# Patient Record
Sex: Female | Born: 1965 | Race: Black or African American | Hispanic: No | Marital: Married | Smoking: Never smoker
Health system: Southern US, Community
[De-identification: ages and names within clinical notes are randomized; demographics above are authoritative.]

## PROBLEM LIST (undated history)

## (undated) DIAGNOSIS — D649 Anemia, unspecified: Secondary | ICD-10-CM

## (undated) DIAGNOSIS — G4730 Sleep apnea, unspecified: Secondary | ICD-10-CM

## (undated) DIAGNOSIS — M1990 Unspecified osteoarthritis, unspecified site: Secondary | ICD-10-CM

## (undated) DIAGNOSIS — G4733 Obstructive sleep apnea (adult) (pediatric): Secondary | ICD-10-CM

## (undated) DIAGNOSIS — Z87898 Personal history of other specified conditions: Secondary | ICD-10-CM

## (undated) DIAGNOSIS — M5136 Other intervertebral disc degeneration, lumbar region: Secondary | ICD-10-CM

## (undated) DIAGNOSIS — N950 Postmenopausal bleeding: Secondary | ICD-10-CM

## (undated) DIAGNOSIS — D509 Iron deficiency anemia, unspecified: Secondary | ICD-10-CM

## (undated) DIAGNOSIS — Z8719 Personal history of other diseases of the digestive system: Secondary | ICD-10-CM

## (undated) DIAGNOSIS — Z973 Presence of spectacles and contact lenses: Secondary | ICD-10-CM

## (undated) DIAGNOSIS — E538 Deficiency of other specified B group vitamins: Secondary | ICD-10-CM

## (undated) DIAGNOSIS — F411 Generalized anxiety disorder: Secondary | ICD-10-CM

## (undated) DIAGNOSIS — J309 Allergic rhinitis, unspecified: Secondary | ICD-10-CM

## (undated) DIAGNOSIS — F909 Attention-deficit hyperactivity disorder, unspecified type: Secondary | ICD-10-CM

## (undated) DIAGNOSIS — F988 Other specified behavioral and emotional disorders with onset usually occurring in childhood and adolescence: Secondary | ICD-10-CM

## (undated) DIAGNOSIS — E559 Vitamin D deficiency, unspecified: Secondary | ICD-10-CM

## (undated) DIAGNOSIS — L709 Acne, unspecified: Secondary | ICD-10-CM

## (undated) DIAGNOSIS — M51369 Other intervertebral disc degeneration, lumbar region without mention of lumbar back pain or lower extremity pain: Secondary | ICD-10-CM

## (undated) DIAGNOSIS — Z8659 Personal history of other mental and behavioral disorders: Secondary | ICD-10-CM

## (undated) HISTORY — PX: WRIST GANGLION EXCISION: SUR520

## (undated) HISTORY — DX: Anemia, unspecified: D64.9

## (undated) HISTORY — PX: TUMOR REMOVAL: SHX12

## (undated) HISTORY — DX: Other specified behavioral and emotional disorders with onset usually occurring in childhood and adolescence: F98.8

## (undated) HISTORY — PX: REDUCTION MAMMAPLASTY: SUR839

## (undated) HISTORY — PX: CYSTECTOMY: SUR359

## (undated) HISTORY — DX: Acne, unspecified: L70.9

## (undated) HISTORY — PX: FOOT SURGERY: SHX648

---

## 1997-09-21 ENCOUNTER — Encounter: Admission: RE | Admit: 1997-09-21 | Discharge: 1997-12-20 | Payer: Self-pay | Admitting: Family Medicine

## 1998-05-15 HISTORY — PX: HYSTEROSCOPY WITH RESECTOSCOPE: SHX5395

## 1998-07-30 ENCOUNTER — Other Ambulatory Visit: Admission: RE | Admit: 1998-07-30 | Discharge: 1998-07-30 | Payer: Self-pay | Admitting: Obstetrics and Gynecology

## 1998-08-17 ENCOUNTER — Ambulatory Visit (HOSPITAL_COMMUNITY): Admission: RE | Admit: 1998-08-17 | Discharge: 1998-08-17 | Payer: Self-pay | Admitting: Obstetrics and Gynecology

## 1998-08-17 ENCOUNTER — Encounter: Payer: Self-pay | Admitting: Obstetrics and Gynecology

## 1998-10-05 ENCOUNTER — Ambulatory Visit (HOSPITAL_COMMUNITY): Admission: RE | Admit: 1998-10-05 | Discharge: 1998-10-05 | Payer: Self-pay | Admitting: Obstetrics and Gynecology

## 1999-02-03 ENCOUNTER — Encounter (INDEPENDENT_AMBULATORY_CARE_PROVIDER_SITE_OTHER): Payer: Self-pay | Admitting: Specialist

## 1999-02-03 ENCOUNTER — Ambulatory Visit (HOSPITAL_COMMUNITY): Admission: RE | Admit: 1999-02-03 | Discharge: 1999-02-03 | Payer: Self-pay | Admitting: Obstetrics and Gynecology

## 1999-05-16 HISTORY — PX: TUMOR REMOVAL: SHX12

## 1999-10-14 ENCOUNTER — Encounter: Payer: Self-pay | Admitting: Internal Medicine

## 2000-01-22 HISTORY — PX: BREAST REDUCTION SURGERY: SHX8

## 2000-01-23 ENCOUNTER — Ambulatory Visit (HOSPITAL_BASED_OUTPATIENT_CLINIC_OR_DEPARTMENT_OTHER): Admission: RE | Admit: 2000-01-23 | Discharge: 2000-01-24 | Payer: Self-pay | Admitting: Specialist

## 2000-01-23 ENCOUNTER — Encounter (INDEPENDENT_AMBULATORY_CARE_PROVIDER_SITE_OTHER): Payer: Self-pay | Admitting: Specialist

## 2000-05-09 ENCOUNTER — Other Ambulatory Visit: Admission: RE | Admit: 2000-05-09 | Discharge: 2000-05-09 | Payer: Self-pay | Admitting: Obstetrics and Gynecology

## 2000-06-03 ENCOUNTER — Inpatient Hospital Stay (HOSPITAL_COMMUNITY): Admission: AD | Admit: 2000-06-03 | Discharge: 2000-06-03 | Payer: Self-pay | Admitting: Obstetrics and Gynecology

## 2000-06-06 ENCOUNTER — Inpatient Hospital Stay (HOSPITAL_COMMUNITY): Admission: AD | Admit: 2000-06-06 | Discharge: 2000-06-06 | Payer: Self-pay | Admitting: Obstetrics and Gynecology

## 2000-09-04 ENCOUNTER — Other Ambulatory Visit: Admission: RE | Admit: 2000-09-04 | Discharge: 2000-09-04 | Payer: Self-pay | Admitting: Gynecology

## 2000-10-23 ENCOUNTER — Other Ambulatory Visit: Admission: RE | Admit: 2000-10-23 | Discharge: 2000-10-23 | Payer: Self-pay | Admitting: Specialist

## 2001-04-02 ENCOUNTER — Encounter (INDEPENDENT_AMBULATORY_CARE_PROVIDER_SITE_OTHER): Payer: Self-pay

## 2001-04-02 ENCOUNTER — Inpatient Hospital Stay (HOSPITAL_COMMUNITY): Admission: AD | Admit: 2001-04-02 | Discharge: 2001-04-05 | Payer: Self-pay | Admitting: Gynecology

## 2001-04-06 ENCOUNTER — Encounter: Admission: RE | Admit: 2001-04-06 | Discharge: 2001-05-06 | Payer: Self-pay | Admitting: Gynecology

## 2001-05-27 ENCOUNTER — Other Ambulatory Visit: Admission: RE | Admit: 2001-05-27 | Discharge: 2001-05-27 | Payer: Self-pay | Admitting: Gynecology

## 2001-06-06 ENCOUNTER — Encounter: Admission: RE | Admit: 2001-06-06 | Discharge: 2001-07-06 | Payer: Self-pay | Admitting: Gynecology

## 2001-07-07 ENCOUNTER — Encounter: Admission: RE | Admit: 2001-07-07 | Discharge: 2001-08-06 | Payer: Self-pay | Admitting: Gynecology

## 2001-09-04 ENCOUNTER — Encounter: Admission: RE | Admit: 2001-09-04 | Discharge: 2001-10-04 | Payer: Self-pay | Admitting: Gynecology

## 2002-05-15 HISTORY — PX: FOOT SURGERY: SHX648

## 2002-09-04 ENCOUNTER — Other Ambulatory Visit: Admission: RE | Admit: 2002-09-04 | Discharge: 2002-09-04 | Payer: Self-pay | Admitting: Gynecology

## 2003-02-02 ENCOUNTER — Observation Stay (HOSPITAL_COMMUNITY): Admission: AD | Admit: 2003-02-02 | Discharge: 2003-02-02 | Payer: Self-pay | Admitting: Gynecology

## 2003-02-02 ENCOUNTER — Encounter: Payer: Self-pay | Admitting: Gynecology

## 2003-02-05 ENCOUNTER — Inpatient Hospital Stay (HOSPITAL_COMMUNITY): Admission: AD | Admit: 2003-02-05 | Discharge: 2003-02-05 | Payer: Self-pay | Admitting: Gynecology

## 2003-02-09 ENCOUNTER — Inpatient Hospital Stay (HOSPITAL_COMMUNITY): Admission: AD | Admit: 2003-02-09 | Discharge: 2003-02-09 | Payer: Self-pay | Admitting: Gynecology

## 2003-02-12 ENCOUNTER — Encounter: Admission: RE | Admit: 2003-02-12 | Discharge: 2003-02-12 | Payer: Self-pay | Admitting: Gynecology

## 2003-02-16 ENCOUNTER — Encounter: Admission: RE | Admit: 2003-02-16 | Discharge: 2003-02-16 | Payer: Self-pay | Admitting: Gynecology

## 2003-02-18 ENCOUNTER — Inpatient Hospital Stay (HOSPITAL_COMMUNITY): Admission: AD | Admit: 2003-02-18 | Discharge: 2003-02-21 | Payer: Self-pay | Admitting: Internal Medicine

## 2003-02-18 ENCOUNTER — Encounter (INDEPENDENT_AMBULATORY_CARE_PROVIDER_SITE_OTHER): Payer: Self-pay

## 2003-02-22 ENCOUNTER — Encounter: Admission: RE | Admit: 2003-02-22 | Discharge: 2003-03-24 | Payer: Self-pay | Admitting: Gynecology

## 2003-03-25 ENCOUNTER — Encounter: Admission: RE | Admit: 2003-03-25 | Discharge: 2003-04-24 | Payer: Self-pay | Admitting: Gynecology

## 2003-04-01 ENCOUNTER — Other Ambulatory Visit: Admission: RE | Admit: 2003-04-01 | Discharge: 2003-04-01 | Payer: Self-pay | Admitting: Obstetrics and Gynecology

## 2003-05-25 ENCOUNTER — Encounter: Admission: RE | Admit: 2003-05-25 | Discharge: 2003-06-24 | Payer: Self-pay | Admitting: Gynecology

## 2003-06-25 ENCOUNTER — Encounter: Admission: RE | Admit: 2003-06-25 | Discharge: 2003-07-25 | Payer: Self-pay | Admitting: Gynecology

## 2004-04-28 ENCOUNTER — Other Ambulatory Visit: Admission: RE | Admit: 2004-04-28 | Discharge: 2004-04-28 | Payer: Self-pay | Admitting: Gynecology

## 2005-03-13 ENCOUNTER — Encounter: Payer: Self-pay | Admitting: Internal Medicine

## 2006-04-26 ENCOUNTER — Other Ambulatory Visit: Admission: RE | Admit: 2006-04-26 | Discharge: 2006-04-26 | Payer: Self-pay | Admitting: Obstetrics and Gynecology

## 2006-04-27 ENCOUNTER — Encounter: Payer: Self-pay | Admitting: Internal Medicine

## 2007-06-13 ENCOUNTER — Encounter: Payer: Self-pay | Admitting: Internal Medicine

## 2007-06-20 ENCOUNTER — Encounter: Admission: RE | Admit: 2007-06-20 | Discharge: 2007-06-20 | Payer: Self-pay | Admitting: Family Medicine

## 2008-06-09 ENCOUNTER — Encounter (INDEPENDENT_AMBULATORY_CARE_PROVIDER_SITE_OTHER): Payer: Self-pay | Admitting: *Deleted

## 2008-07-21 ENCOUNTER — Ambulatory Visit: Payer: Self-pay | Admitting: Internal Medicine

## 2008-07-21 ENCOUNTER — Encounter (INDEPENDENT_AMBULATORY_CARE_PROVIDER_SITE_OTHER): Payer: Self-pay | Admitting: *Deleted

## 2008-07-21 DIAGNOSIS — F988 Other specified behavioral and emotional disorders with onset usually occurring in childhood and adolescence: Secondary | ICD-10-CM

## 2008-08-07 ENCOUNTER — Encounter: Payer: Self-pay | Admitting: Internal Medicine

## 2008-10-14 ENCOUNTER — Telehealth (INDEPENDENT_AMBULATORY_CARE_PROVIDER_SITE_OTHER): Payer: Self-pay | Admitting: *Deleted

## 2008-11-20 ENCOUNTER — Telehealth (INDEPENDENT_AMBULATORY_CARE_PROVIDER_SITE_OTHER): Payer: Self-pay | Admitting: *Deleted

## 2008-12-30 ENCOUNTER — Telehealth (INDEPENDENT_AMBULATORY_CARE_PROVIDER_SITE_OTHER): Payer: Self-pay | Admitting: *Deleted

## 2008-12-31 ENCOUNTER — Encounter: Admission: RE | Admit: 2008-12-31 | Discharge: 2008-12-31 | Payer: Self-pay | Admitting: Family Medicine

## 2009-01-11 LAB — CONVERTED CEMR LAB
ALT: 15 units/L (ref 0–35)
AST: 19 units/L (ref 0–37)
BUN: 10 mg/dL (ref 6–23)
Basophils Absolute: 0 10*3/uL (ref 0.0–0.1)
Basophils Relative: 0 % (ref 0–1)
CO2: 26 meq/L (ref 19–32)
Calcium: 9.6 mg/dL (ref 8.4–10.5)
Chloride: 105 meq/L (ref 96–112)
Cholesterol: 128 mg/dL (ref 0–200)
Creatinine, Ser: 0.63 mg/dL (ref 0.40–1.20)
Eosinophils Absolute: 0.2 10*3/uL (ref 0.0–0.7)
Eosinophils Relative: 3 % (ref 0–5)
Glucose, Bld: 88 mg/dL (ref 70–99)
HCT: 36.7 % (ref 36.0–46.0)
HDL: 53 mg/dL (ref >39)
Hemoglobin: 11.7 g/dL — ABNORMAL LOW (ref 12.0–15.0)
LDL Cholesterol: 59 mg/dL (ref 0–99)
Lymphocytes Relative: 36 % (ref 12–46)
Lymphs Abs: 1.9 10*3/uL (ref 0.7–4.0)
MCHC: 31.9 g/dL (ref 30.0–36.0)
MCV: 92 fL (ref 78.0–100.0)
Monocytes Absolute: 0.4 10*3/uL (ref 0.1–1.0)
Monocytes Relative: 8 % (ref 3–12)
Neutro Abs: 2.7 10*3/uL (ref 1.7–7.7)
Neutrophils Relative %: 52 % (ref 43–77)
Platelets: 180 10*3/uL (ref 150–400)
Potassium: 4.2 meq/L (ref 3.5–5.3)
RBC: 3.99 M/uL (ref 3.87–5.11)
RDW: 12.9 % (ref 11.5–15.5)
Sodium: 138 meq/L (ref 135–145)
TSH: 1.024 microintl units/mL (ref 0.350–4.500)
Total CHOL/HDL Ratio: 2.4
Triglycerides: 80 mg/dL (ref <150)
VLDL: 16 mg/dL (ref 0–40)
WBC: 5.3 10*3/uL (ref 4.0–10.5)

## 2009-01-28 ENCOUNTER — Encounter (INDEPENDENT_AMBULATORY_CARE_PROVIDER_SITE_OTHER): Payer: Self-pay | Admitting: *Deleted

## 2009-02-05 ENCOUNTER — Ambulatory Visit: Payer: Self-pay | Admitting: Internal Medicine

## 2009-02-05 DIAGNOSIS — D649 Anemia, unspecified: Secondary | ICD-10-CM

## 2009-03-12 ENCOUNTER — Telehealth (INDEPENDENT_AMBULATORY_CARE_PROVIDER_SITE_OTHER): Payer: Self-pay | Admitting: *Deleted

## 2009-04-02 ENCOUNTER — Encounter: Payer: Self-pay | Admitting: Internal Medicine

## 2009-04-02 ENCOUNTER — Ambulatory Visit: Payer: Self-pay | Admitting: Internal Medicine

## 2009-04-02 ENCOUNTER — Telehealth (INDEPENDENT_AMBULATORY_CARE_PROVIDER_SITE_OTHER): Payer: Self-pay | Admitting: *Deleted

## 2009-04-02 DIAGNOSIS — R079 Chest pain, unspecified: Secondary | ICD-10-CM

## 2009-04-02 LAB — CONVERTED CEMR LAB
Basophils Absolute: 0 10*3/uL (ref 0.0–0.1)
Basophils Relative: 0 % (ref 0–1)
Eosinophils Absolute: 0.1 10*3/uL (ref 0.0–0.7)
Hemoglobin: 11.5 g/dL — ABNORMAL LOW (ref 12.0–15.0)
MCHC: 32.3 g/dL (ref 30.0–36.0)
MCV: 90.4 fL (ref 78.0–100.0)
Monocytes Absolute: 0.6 10*3/uL (ref 0.1–1.0)
Neutro Abs: 3.4 10*3/uL (ref 1.7–7.7)
RDW: 12.3 % (ref 11.5–15.5)

## 2009-04-05 ENCOUNTER — Telehealth (INDEPENDENT_AMBULATORY_CARE_PROVIDER_SITE_OTHER): Payer: Self-pay | Admitting: *Deleted

## 2009-04-05 ENCOUNTER — Encounter (INDEPENDENT_AMBULATORY_CARE_PROVIDER_SITE_OTHER): Payer: Self-pay | Admitting: *Deleted

## 2009-04-06 ENCOUNTER — Encounter (INDEPENDENT_AMBULATORY_CARE_PROVIDER_SITE_OTHER): Payer: Self-pay | Admitting: *Deleted

## 2009-04-30 ENCOUNTER — Ambulatory Visit: Payer: Self-pay | Admitting: Internal Medicine

## 2009-05-04 ENCOUNTER — Encounter (INDEPENDENT_AMBULATORY_CARE_PROVIDER_SITE_OTHER): Payer: Self-pay | Admitting: *Deleted

## 2009-05-04 LAB — CONVERTED CEMR LAB: Fecal Occult Bld: NEGATIVE

## 2009-05-06 ENCOUNTER — Other Ambulatory Visit: Admission: RE | Admit: 2009-05-06 | Discharge: 2009-05-06 | Payer: Self-pay | Admitting: Obstetrics and Gynecology

## 2009-05-06 ENCOUNTER — Ambulatory Visit: Payer: Self-pay | Admitting: Women's Health

## 2009-06-28 ENCOUNTER — Ambulatory Visit: Payer: Self-pay | Admitting: Gynecology

## 2009-07-28 ENCOUNTER — Telehealth (INDEPENDENT_AMBULATORY_CARE_PROVIDER_SITE_OTHER): Payer: Self-pay | Admitting: *Deleted

## 2009-08-06 ENCOUNTER — Ambulatory Visit: Payer: Self-pay | Admitting: Internal Medicine

## 2009-08-31 ENCOUNTER — Telehealth (INDEPENDENT_AMBULATORY_CARE_PROVIDER_SITE_OTHER): Payer: Self-pay | Admitting: *Deleted

## 2009-10-13 ENCOUNTER — Telehealth (INDEPENDENT_AMBULATORY_CARE_PROVIDER_SITE_OTHER): Payer: Self-pay | Admitting: *Deleted

## 2009-10-21 ENCOUNTER — Ambulatory Visit: Payer: Self-pay | Admitting: Women's Health

## 2009-11-22 ENCOUNTER — Telehealth (INDEPENDENT_AMBULATORY_CARE_PROVIDER_SITE_OTHER): Payer: Self-pay | Admitting: *Deleted

## 2009-12-24 ENCOUNTER — Telehealth (INDEPENDENT_AMBULATORY_CARE_PROVIDER_SITE_OTHER): Payer: Self-pay | Admitting: *Deleted

## 2010-02-02 ENCOUNTER — Telehealth: Payer: Self-pay | Admitting: Internal Medicine

## 2010-02-10 ENCOUNTER — Ambulatory Visit: Payer: Self-pay | Admitting: Internal Medicine

## 2010-03-16 ENCOUNTER — Telehealth (INDEPENDENT_AMBULATORY_CARE_PROVIDER_SITE_OTHER): Payer: Self-pay | Admitting: *Deleted

## 2010-04-18 ENCOUNTER — Telehealth (INDEPENDENT_AMBULATORY_CARE_PROVIDER_SITE_OTHER): Payer: Self-pay | Admitting: *Deleted

## 2010-05-24 ENCOUNTER — Telehealth (INDEPENDENT_AMBULATORY_CARE_PROVIDER_SITE_OTHER): Payer: Self-pay | Admitting: *Deleted

## 2010-06-05 ENCOUNTER — Encounter: Payer: Self-pay | Admitting: Obstetrics and Gynecology

## 2010-06-12 LAB — CONVERTED CEMR LAB
Albumin: 4.1 g/dL
BUN: 8 mg/dL
Cholesterol: 175 mg/dL
Creatinine, Ser: 0.59 mg/dL
Ferritin: 52 ng/mL
Globulin: 3.4 g/dL
LDL Cholesterol: 94 mg/dL
RBC count: 3.76 10*6/uL
TIBC: 345 ug/dL
TSH: 2.255 microintl units/mL
Triglycerides: 95 mg/dL
WBC, blood: 5.7 10*3/uL

## 2010-06-14 NOTE — Progress Notes (Signed)
Summary: rx  Phone Note Refill Request Call back at [phone] Message from:  Patient on March 16, 2010 8:28 AM  Refills Requested: Medication #1:  CONCERTA 54 MG CR-TABS 1 by mouth once daily   Dosage confirmed as above?Dosage Confirmed   Supply Requested: 1 month pick up when ready//husband brought in this morning.  Initial call taken by: Freddy Jaksch,  March 16, 2010 8:28 AM  Follow-up for Phone Call        pts husband aware. Army Fossa CMA  March 16, 2010 8:33 AM     Prescriptions: CONCERTA 54 MG CR-TABS (METHYLPHENIDATE HCL) 1 by mouth once daily  #30 x 0   Entered by:   Army Fossa CMA   Authorized by:   Nolon Rod. Paz MD   Signed by:   Army Fossa CMA on 03/16/2010   Method used:   Print then Give to Patient   RxID:   [id number]

## 2010-06-14 NOTE — Progress Notes (Signed)
Summary: concerta rx  Phone Note Refill Request Call back at [phone] Message from:  Patient  Refills Requested: Medication #1:  CONCERTA 54 MG CR-TABS 1 by mouth once daily last ov 02/05/09, last refill # 30x 0 on 06/21/09 left msg rx will be ready for pickup today, Due for 6 mos vist please schedule office visit when picking up rx  Initial call taken by: Kandice Hams,  July 28, 2009 11:48 AM    Prescriptions: CONCERTA 54 MG CR-TABS (METHYLPHENIDATE HCL) 1 by mouth once daily  #30 x 0   Entered by:   Kandice Hams   Authorized by:   Nolon Rod. Paz MD   Signed by:   Kandice Hams on 07/28/2009   Method used:   Print then Give to Patient   RxID:   [id number]

## 2010-06-14 NOTE — Assessment & Plan Note (Signed)
Summary: 6 mth fu/kdc   Vital Signs:  Patient profile:   45 year old female Height:      65.5 inches Weight:      188.2 pounds BMI:     30.95 Pulse rate:   60 / minute BP sitting:   110 / 80  Vitals Entered By: R.Peeler CC: roa   History of Present Illness: here for refills  Allergies: No Known Drug Allergies  Past History:  Past Medical History: not on BC P , just doing "rhytm control" ADD G3 P3 H/O on-off  CP, previous PCP did a stress test and ECHO (-) chest pain , non cardiac ,  per patient was about  2000, saw Dr Alwyn Ren 11-10 "costochondritis" Anemia-NOS  Past Surgical History: Reviewed history from 07/21/2008 and no changes required. Caesarean section x 2  wrist cysts removal B uterine fibroid tumor removed   Review of Systems       doing well ADHD well controlled with present medications sleeps well she does have some stress, job-related, but over all denies anxiety or depression she continued to be concerned about her chest pain which is  an old problem for years, currently chest pain-free  Physical Exam  General:  alert, well-developed, and well-nourished.   Chest Wall:  not tender at the costochondral area Lungs:  normal respiratory effort, no intercostal retractions, no accessory muscle use, and normal breath sounds.   Heart:  normal rate, regular rhythm, and no murmur.     Impression & Recommendations:  Problem # 1:  ADD (ICD-314.00) symptoms well controlled, call for a concert for  RF  once a month, office visit in 6 months  Problem # 2:  CHEST PAIN (ICD-786.50) chronic on and off symptoms, would like further testing to clarify the etiology  (although she already had extensive W/Us) recommend to have a office visit with next exacerbation to see what is appropiate to do   Complete Medication List: 1)  Concerta 54 Mg Cr-tabs (Methylphenidate hcl) .Marland Kitchen.. 1 by mouth once daily 2)  Claritin 10 Mg Tabs (Loratadine) .... As directed

## 2010-06-14 NOTE — Progress Notes (Signed)
Summary: refill- concerta  Phone Note Refill Request Call back at [phone] Message from:  spouse  Refills Requested: Medication #1:  CONCERTA 54 MG CR-TABS 1 by mouth once daily Initial call taken by: Jeremy Johann CMA,  February 02, 2010 8:49 AM  Follow-up for Phone Call        Filled for pt, pt needs to make appt before additonial refills.    Prescriptions: CONCERTA 54 MG CR-TABS (METHYLPHENIDATE HCL) 1 by mouth once daily  #30 x 0   Entered by:   Army Fossa CMA   Authorized by:   Nolon Rod. Paz MD   Signed by:   Army Fossa CMA on 02/02/2010   Method used:   Print then Give to Patient   RxID:   [id number]

## 2010-06-14 NOTE — Progress Notes (Signed)
Summary: concerta refill  Phone Note Refill Request Call back at [phone] Message from:  Patient's spouse Chrissie Noa on April 18, 2010 5:03 PM  Refills Requested: Medication #1:  CONCERTA 54 MG CR-TABS 1 by mouth once daily Patient needs Concertal prescription refilled.  Advised patient that it will be ready in 24 hours for pickup unless someone calls them  Next Appointment Scheduled: mon 08/15/2010 Initial call taken by: Jerolyn Shin,  April 18, 2010 5:04 PM    Prescriptions: CONCERTA 54 MG CR-TABS (METHYLPHENIDATE HCL) 1 by mouth once daily  #30 x 0   Entered by:   Army Fossa CMA   Authorized by:   Nolon Rod. Paz MD   Signed by:   Army Fossa CMA on 04/19/2010   Method used:   Print then Give to Patient   RxID:   [id number]

## 2010-06-14 NOTE — Assessment & Plan Note (Signed)
Summary: 6 mth fu/kdc   Vital Signs:  Patient profile:   45 year old female Weight:      198.25 pounds Pulse rate:   105 / minute Pulse rhythm:   regular BP sitting:   116 / 80  (left arm) Cuff size:   large  Vitals Entered By: Army Fossa CMA (February 10, 2010 3:55 PM) CC: 6 month f/u- not fasting  Comments no complaints, declines flu shot    History of Present Illness:  here for a refill on Concerta, doing well  Current Medications (verified): 1)  Concerta 54 Mg Cr-Tabs (Methylphenidate Hcl) .Marland Kitchen.. 1 By Mouth Once Daily 2)  Claritin 10 Mg Tabs (Loratadine) .... As Directed  Allergies (verified): No Known Drug Allergies  Past History:  Past Medical History: not on BC P , just doing "rhytm control" ADD G3 P3 H/O on-off  CP, previous PCP did a stress test and ECHO (-) chest pain , non cardiac ,  per patient was about  2000, saw Dr Alwyn Ren 11-10 "costochondritis" Anemia-NOS  Past Surgical History: Reviewed history from 07/21/2008 and no changes required. Caesarean section x 2  wrist cysts removal B uterine fibroid tumor removed   Social History: Reviewed history from 04/02/2009 and no changes required. 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   Lungs:  normal respiratory effort, no intercostal retractions, no accessory muscle use, and normal breath sounds.   Heart:  normal rate, regular rhythm, and no murmur.   Psych:  not anxious appearing and not depressed appearing.     Impression & Recommendations:  Problem # 1:  ADD (ICD-314.00) well controlled, refill Concerta when needed  Problem # 2:  ANEMIA-NOS (ICD-285.9) history of anemia, hemoglobin is stable over time, no history of iron  deficiency. Plan: Recheck on return to the office  Problem # 3:  recommend him back in 6 months for a physical  Complete Medication List: 1)  Concerta 54 Mg Cr-tabs (Methylphenidate hcl) .Marland Kitchen.. 1 by mouth once daily 2)  Claritin 10 Mg Tabs (Loratadine) .... As directed  Patient Instructions: 1)  Please schedule a follow-up appointment in 6 months .

## 2010-06-14 NOTE — Progress Notes (Signed)
Summary: Refill Request  Phone Note Refill Request Message from:  Patient on November 22, 2009 10:25 AM  Refills Requested: Medication #1:  CONCERTA 54 MG CR-TABS 1 by mouth once daily   Dosage confirmed as above?Dosage Confirmed   Supply Requested: 1 month  Method Requested: Pick up at Office Next Appointment Scheduled: 9.26.11 Initial call taken by: Harold Barban,  November 22, 2009 10:25 AM  Follow-up for Phone Call        Pt is aware rx is ready.Army Fossa CMA  November 22, 2009 11:18 AM     Prescriptions: CONCERTA 54 MG CR-TABS (METHYLPHENIDATE HCL) 1 by mouth once daily  #30 x 0   Entered by:   Army Fossa CMA   Authorized by:   Nolon Rod. Paz MD   Signed by:   Army Fossa CMA on 11/22/2009   Method used:   Print then Give to Patient   RxID:   [id number]

## 2010-06-14 NOTE — Progress Notes (Signed)
Summary: refill--concerta  Phone Note Refill Request Message from:  Patient on December 24, 2009 11:16 AM  Refills Requested: Medication #1:  CONCERTA 54 MG CR-TABS 1 by mouth once daily   Dosage confirmed as above?Dosage Confirmed   Supply Requested: 1 month   Last Refilled: 11/22/2009 Next Appointment Scheduled: 02-07-10 Dr Drue Novel Initial call taken by: Mervin Kung CMA Duncan Dull),  December 24, 2009 11:17 AM  Follow-up for Phone Call        spoke with pt aware rx will be ready after 3 pm. Army Fossa CMA  December 24, 2009 11:19 AM     Prescriptions: CONCERTA 54 MG CR-TABS (METHYLPHENIDATE HCL) 1 by mouth once daily  #30 x 0   Entered by:   Mervin Kung CMA (AAMA)   Authorized by:   Nolon Rod. Paz MD   Signed by:   Mervin Kung CMA (AAMA) on 12/24/2009   Method used:   Print then Give to Patient   RxID:   [id number]

## 2010-06-14 NOTE — Progress Notes (Signed)
Summary: refill concerta  Phone Note Refill Request Call back at [phone] Message from:  Patient  Refills Requested: Medication #1:  CONCERTA 54 MG CR-TABS 1 by mouth once daily left msg rx will be ready for pickup today .Kandice Hams  October 13, 2009 8:40 AM   Initial call taken by: Kandice Hams,  October 13, 2009 8:41 AM    Prescriptions: CONCERTA 54 MG CR-TABS (METHYLPHENIDATE HCL) 1 by mouth once daily  #30 x 0   Entered by:   Kandice Hams   Authorized by:   Nolon Rod. Paz MD   Signed by:   Kandice Hams on 10/13/2009   Method used:   Print then Give to Patient   RxID:   [id number]

## 2010-06-14 NOTE — Progress Notes (Signed)
Summary: rx concerta  Phone Note Refill Request Call back at [phone] Message from:  Patient  Refills Requested: Medication #1:  CONCERTA 54 MG CR-TABS 1 by mouth once daily Initial call taken by: Kandice Hams,  August 31, 2009 11:46 AM  Follow-up for Phone Call        last ov 08/06/09, last refill #30 x 0 on 07/28/09. Spoke with pt informed rx will be ready for pickup today .Kandice Hams  August 31, 2009 12:02 PM  Follow-up by: Kandice Hams,  August 31, 2009 12:02 PM    Prescriptions: CONCERTA 54 MG CR-TABS (METHYLPHENIDATE HCL) 1 by mouth once daily  #30 x 0   Entered by:   Kandice Hams   Authorized by:   Nolon Rod. Paz MD   Signed by:   Kandice Hams on 08/31/2009   Method used:   Print then Give to Patient   RxID:   [id number]

## 2010-06-16 NOTE — Progress Notes (Signed)
Summary: Refill Request  Phone Note Refill Request Call back at [phone] Message from:  Patient on May 24, 2010 8:16 AM  Refills Requested: Medication #1:  CONCERTA 54 MG CR-TABS 1 by mouth once daily  Method Requested: Pick up at Office Next Appointment Scheduled: 08/15/2010 Initial call taken by: Barnie Mort,  May 24, 2010 8:16 AM Caller: Spouse  Follow-up for Phone Call        Pts spouse is aware. Army Fossa CMA  May 24, 2010 8:21 AM     Prescriptions: CONCERTA 54 MG CR-TABS (METHYLPHENIDATE HCL) 1 by mouth once daily  #30 x 0   Entered by:   Army Fossa CMA   Authorized by:   Nolon Rod. Paz MD   Signed by:   Army Fossa CMA on 05/24/2010   Method used:   Print then Give to Patient   RxID:   [id number]

## 2010-06-27 ENCOUNTER — Telehealth: Payer: Self-pay | Admitting: Internal Medicine

## 2010-07-06 NOTE — Progress Notes (Signed)
Summary: Refill--Concerta  Phone Note Refill Request Call back at [phone] Message from:  Patient on June 27, 2010 2:23 PM  Refills Requested: Medication #1:  CONCERTA 54 MG CR-TABS 1 by mouth once daily Initial call taken by: Lucious Groves CMA,  June 27, 2010 2:23 PM  Follow-up for Phone Call        Father notified and will pick up prescription tomorrow. Follow-up by: Lucious Groves CMA,  June 27, 2010 3:11 PM    Prescriptions: CONCERTA 54 MG CR-TABS (METHYLPHENIDATE HCL) 1 by mouth once daily  #30 x 0   Entered by:   Lucious Groves CMA   Authorized by:   Nolon Rod. Paz MD   Signed by:   Lucious Groves CMA on 06/27/2010   Method used:   Print then Give to Patient   RxID:   [id number]

## 2010-08-04 ENCOUNTER — Other Ambulatory Visit: Payer: Self-pay | Admitting: *Deleted

## 2010-08-04 ENCOUNTER — Encounter: Payer: Self-pay | Admitting: Internal Medicine

## 2010-08-04 MED ORDER — METHYLPHENIDATE HCL ER (OSM) 54 MG PO TBCR: 54.0000 mg | EXTENDED_RELEASE_TABLET | ORAL | Status: DC

## 2010-08-04 NOTE — Telephone Encounter (Signed)
lmom aware rx ready for pickup

## 2010-08-15 ENCOUNTER — Encounter: Payer: Self-pay | Admitting: Internal Medicine

## 2010-08-26 ENCOUNTER — Other Ambulatory Visit (INDEPENDENT_AMBULATORY_CARE_PROVIDER_SITE_OTHER): Payer: BLUE CROSS/BLUE SHIELD

## 2010-08-26 ENCOUNTER — Telehealth: Payer: Self-pay | Admitting: Internal Medicine

## 2010-08-26 DIAGNOSIS — Z0000 Encounter for general adult medical examination without abnormal findings: Secondary | ICD-10-CM

## 2010-08-26 LAB — CBC WITH DIFFERENTIAL/PLATELET
Basophils Absolute: 0 10*3/uL (ref 0.0–0.1)
HCT: 35 % — ABNORMAL LOW (ref 36.0–46.0)
Hemoglobin: 11.9 g/dL — ABNORMAL LOW (ref 12.0–15.0)
Lymphs Abs: 2 10*3/uL (ref 0.7–4.0)
MCHC: 34.1 g/dL (ref 30.0–36.0)
MCV: 89.4 fl (ref 78.0–100.0)
Monocytes Absolute: 0.5 10*3/uL (ref 0.1–1.0)
Neutro Abs: 3.2 10*3/uL (ref 1.4–7.7)
Platelets: 166 10*3/uL (ref 150.0–400.0)
RDW: 12.9 % (ref 11.5–14.6)

## 2010-08-26 LAB — IRON: Iron: 79 ug/dL (ref 42–145)

## 2010-08-26 LAB — FERRITIN: Ferritin: 25 ng/mL (ref 10.0–291.0)

## 2010-08-26 LAB — LIPID PANEL
Cholesterol: 160 mg/dL (ref 0–200)
VLDL: 13 mg/dL (ref 0.0–40.0)

## 2010-08-26 LAB — BASIC METABOLIC PANEL
BUN: 10 mg/dL (ref 6–23)
CO2: 30 mEq/L (ref 19–32)
Chloride: 103 mEq/L (ref 96–112)
Glucose, Bld: 73 mg/dL (ref 70–99)
Potassium: 4.2 mEq/L (ref 3.5–5.1)
Sodium: 138 mEq/L (ref 135–145)

## 2010-08-26 LAB — HEPATIC FUNCTION PANEL
Albumin: 3.9 g/dL (ref 3.5–5.2)
Alkaline Phosphatase: 74 U/L (ref 39–117)
Total Protein: 7.4 g/dL (ref 6.0–8.3)

## 2010-08-26 NOTE — Telephone Encounter (Signed)
Pt wants an A1C test added to her lab work that was done today due to recent left eye vision change. OBGYN told her that her sugar had been high.

## 2010-08-26 NOTE — Telephone Encounter (Signed)
Pt asked about this after her lab draw. We need 2 purple tubes to do this per [facility]. We only have 1. Will redraw and her physical.

## 2010-09-05 ENCOUNTER — Encounter: Payer: Self-pay | Admitting: Internal Medicine

## 2010-09-05 ENCOUNTER — Ambulatory Visit (INDEPENDENT_AMBULATORY_CARE_PROVIDER_SITE_OTHER): Payer: BLUE CROSS/BLUE SHIELD | Admitting: Internal Medicine

## 2010-09-05 DIAGNOSIS — K625 Hemorrhage of anus and rectum: Secondary | ICD-10-CM

## 2010-09-05 DIAGNOSIS — R739 Hyperglycemia, unspecified: Secondary | ICD-10-CM

## 2010-09-05 DIAGNOSIS — R7309 Other abnormal glucose: Secondary | ICD-10-CM

## 2010-09-05 DIAGNOSIS — Z0000 Encounter for general adult medical examination without abnormal findings: Secondary | ICD-10-CM | POA: Insufficient documentation

## 2010-09-05 LAB — HEMOGLOBIN A1C: Hgb A1c MFr Bld: 5.1 % (ref 4.6–6.5)

## 2010-09-05 MED ORDER — METHYLPHENIDATE HCL ER (OSM) 54 MG PO TBCR: 54.0000 mg | EXTENDED_RELEASE_TABLET | ORAL | Status: DC

## 2010-09-05 MED ORDER — TETANUS-DIPHTH-ACELL PERTUSSIS 5-2.5-18.5 LF-MCG/0.5 IM SUSP
0.5000 mL | Freq: Once | INTRAMUSCULAR | Status: AC
  Administered 2010-09-05: 0.5 mL via INTRAMUSCULAR

## 2010-09-05 NOTE — Assessment & Plan Note (Addendum)
Td 2001, and today Diet, exercise discussed Never had a Cscope , Hg stable at ~ 12, no iron def. She also had some blood in the stools. She declined to have a digital rectal exam today. By the description of her symptoms, does not sound classic for a hemorrhoidal bleed. I recommended a colonoscopy, she is quite reluctant. She eventually agreed to go talk with a GI physician and discuss a colonoscopy. All recent labs discussed . Sees gyn

## 2010-09-05 NOTE — Progress Notes (Signed)
Discussed w/ pt today.

## 2010-09-05 NOTE — Progress Notes (Signed)
  Subjective:    Patient ID: Alexis Carr, female    DOB: Oct 01, 1965, 45 y.o.   MRN: [id number]  HPI CPX Past Medical History  Diagnosis Date  . ADD (attention deficit disorder)   . Chest pain     h/o on and off CP, previous PCP did a stress test and ECHO (-) chest pain,  non cardiac, per pt was about 2000 saw Dr.Hopper 11-1- "costcochondritis"  . Anemia    Past Surgical History  Procedure Date  . Cesarean section     x2  . Cystectomy     wrist   . Tumor removal     Uterine Fibroid    History   Social History  . Marital Status: Married    Spouse Name: N/A    Number of Children: 3  . Years of Education: N/A   Occupational History  . school teacher    Social History Main Topics  . Smoking status: Never Smoker   . Smokeless tobacco: Never Used  . Alcohol Use: No  . Drug Use: No  . Sexually Active: Not on file   Other Topics Concern  . Not on file   Social History Narrative       Family History  Problem Relation Age of Onset  . Coronary artery disease      GM  . Hypertension      GM,GF  . Stroke      GM  . Throat cancer      GF  . Diabetes Neg Hx   . Colon cancer Neg Hx   . Breast cancer Neg Hx      Review of Systems Patient is somehow frustrated about her weight, she walks 40 minutes 3 times a week and thinks that she eats reasonably well. Her blood sugar was elevated when she was at her gynecologist, blood sugar a few days ago here was normal. She would like hemoglobin A1c drawn. She has a long history of chest pain. For years, she still has on an old mid- anterior chest pain without radiation. Chest pain is at baseline. No nausea, vomiting, diarrhea. Denies anxiety or depression. I had a single episode of dark-red blood mixed with the stools few weeks ago.       Objective:   Physical Exam  Constitutional: She is oriented to person, place, and time. She appears well-nourished.  HENT:  Head: Normocephalic.  Eyes: No scleral icterus.  Neck:  No tracheal deviation present.  Cardiovascular: Normal rate, regular rhythm and normal heart sounds.   No murmur heard. Pulmonary/Chest: Effort normal and breath sounds normal. No respiratory distress. She has no rales.  Abdominal: Soft. Bowel sounds are normal. She exhibits no distension. There is no tenderness.  Musculoskeletal: She exhibits no edema.  Neurological: She is alert and oriented to person, place, and time.  Psychiatric: She has a normal mood and affect. Her behavior is normal. Judgment and thought content normal.          Assessment & Plan:

## 2010-09-07 ENCOUNTER — Telehealth: Payer: Self-pay | Admitting: *Deleted

## 2010-09-07 NOTE — Telephone Encounter (Signed)
Message copied by Army Fossa on Wed Sep 07, 2010 11:55 AM ------      Message from: Willow Ora      Created: Tue Sep 06, 2010  9:08 PM       Advise patient:      DM test neg

## 2010-09-07 NOTE — Telephone Encounter (Signed)
Message left for patient to return my call.  

## 2010-09-08 NOTE — Telephone Encounter (Signed)
Message left for patient to return my call.  

## 2010-09-09 NOTE — Telephone Encounter (Signed)
Message left for patient to return my call.  

## 2010-09-12 ENCOUNTER — Encounter: Payer: Self-pay | Admitting: Internal Medicine

## 2010-09-12 ENCOUNTER — Encounter: Payer: Self-pay | Admitting: *Deleted

## 2010-09-12 NOTE — Telephone Encounter (Signed)
Will mail pt a letter.

## 2010-09-26 ENCOUNTER — Telehealth: Payer: Self-pay | Admitting: Internal Medicine

## 2010-09-26 NOTE — Telephone Encounter (Signed)
F Y I-----Rebecca at [facility] called to report that patient cancelled her appt for Tomorrow Tuesday 5/15 with [facility]

## 2010-09-30 NOTE — Discharge Summary (Signed)
[facility]  Patient:    Alexis Carr, Alexis Carr Visit Number: [id number] MRN: [id number]          Service Type: OBS Location: 910A 9113 [id number] Attending Physician:  Tonye Royalty Dictated by:   Antony Contras, N.P. Proc. Date: 04/02/01 Admit Date:  04/02/2001 Discharge Date: 04/05/2001                             Discharge Summary  DISCHARGE DIAGNOSES:          Chorioamnionitis.  PROCEDURE:                    Primary cesarean section, left flap                               transverse, with delivery of viable infant.  HISTORY OF PRESENT ILLNESS:   The patient is a 45 year old gravida 2, para 0-0-1-0 with an LMP of June 25, 2000.  [facility] March 31, 2001.  Prenatal course was complicated by advanced maternal age.  LABORATORY/ACCESSORY DATA:    Blood type O-positive.  Antibody screen negative.  Sickle cell negative.  RPR, HBsAg, HIV nonreactive.  Rubella immune.  AFP and amniocentesis normal.  HOSPITAL COURSE/TREATMENT:    The patient was admitted on April 02, 2001 with spontaneous onset of labor.  On admission, cervix was 4/80%/-3 station. Patient did progress to anterior lip, 0 to -1 station, but this progression was slow and she did develop a temperature elevation to 102.3.  She was given some ampicillin and Tylenol.  At this point, after counseling by Dr. Farrel Gobble, she elected to proceed with cesarean section.  This was performed by Dr. Farrel Gobble under epidural anesthesia.  Patient was delivered of a viable infant in vertex presentation with late onset of thin meconium.  Birth weight 7 pounds 9 ounces.  Apgars 9 and 9 at one and five minutes.  Cord pH 7.3. Normal uterus, tubes and ovaries.  POSTPARTUM COURSE:            Patient did continue to have some low-grade fever, which did resolve and she was able to be discharged on her third postoperative day in satisfactory condition.  DISPOSITION:                  Follow up in six weeks.   Continue with prenatal vitamins and iron.  Motrin and Tylox for pain. Dictated by:   Antony Contras, N.P. Attending Physician:  Tonye Royalty DD:  04/19/01 TD:  04/20/01 Job: [id number]

## 2010-09-30 NOTE — H&P (Signed)
   NAMEJOURNEI, THOMASSEN                         ACCOUNT NO.:  [id number]   MEDICAL RECORD NO.:  [id number]                   PATIENT TYPE:  INP   LOCATION:  9199                                 FACILITY:  [facility]   PHYSICIAN:  Timothy P. Fontaine, M.D.           DATE OF BIRTH:  05/09/1966   DATE OF ADMISSION:  02/18/2003  DATE OF DISCHARGE:                                HISTORY & PHYSICAL   CHIEF COMPLAINT:  Twin gestation, 37 weeks, labor, prior cesarean section  for repeat cesarean section.   HISTORY OF PRESENT ILLNESS:  A 45 year old G46, P27, AB1 female with twin  gestation of 37 weeks, history of prior cesarean section for repeat cesarean  section.  The patient awoke this morning noting contractions every two to  three minutes and on triage evaluation, was found to be 5 cm dilated per  nursing check.   ALLERGIES:  No known drug allergies.   MEDICATIONS:  Vitamins.   PAST MEDICAL HISTORY:  Uncomplicated.   PAST SURGICAL HISTORY:  1. Cesarean section in 2002.  2. D&C.   REVIEW OF SYMPTOMS:  Noncontributory.   FAMILY HISTORY:  Noncontributory.   ADMISSION PHYSICAL EXAMINATION:  GENERAL APPEARANCE:  VITAL SIGNS:  Afebrile.  Vital signs are stable.  HEENT:  Normal.  LUNGS:  Clear.  CARDIOVASCULAR:  Regular rate with no murmurs, rubs, or gallops.  ABDOMEN:  Gravid pregnancy consistent with twins.  External monitor shows  contractions every two minutes with reactive fetal tracing on both twins.  PELVIC:  Per nursing with 5 cm dilated, vertex presentation.   ASSESSMENT:  At 37 weeks, twins, prior cesarean section for repeat, in  labor.  For repeat cesarean section.  I discussed with the patient and her  husband the proposed surgery, the risks and benefits to include bleeding,  transfusion, infection, wound complications requiring opening and draining  of incisions, closure by secondary intention, inadvertant injury to internal  organs including bowel, bladder, ureters,  vessels and nerves necessitating  major exploratory reparative surgeries and future reparative surgeries  including ostomy formation.  The risks of fetal injury during the procedure  was also discussed, understood and accepted.                                              Timothy P. Audie Box, M.D.   TPF/MEDQ  D:  02/18/2003  T:  02/18/2003  Job:  [id number]

## 2010-09-30 NOTE — H&P (Signed)
[facility]  Patient:    Alexis Carr, Alexis Carr Visit Number: [id number] MRN: [id number]          Service Type: Attending:  Gaetano Hawthorne. Lily Peer, M.D. Dictated by:   Gaetano Hawthorne Lily Peer, M.D. Adm. Date:  04/02/01                           History and Physical  CHIEF COMPLAINT:              Contractions.  HISTORY OF PRESENT ILLNESS:   The patient is a 45 year old gravida 2 para 0 AB 1, with an estimated date of confinement of March 31, 2001, currently at 40-2/7th weeks gestation, who presented to the emergency room of [facility] complaining of contractions that started at approximately 1900 hours today.  Fetal movements were appreciated.  The patient denied any fever or chills, nausea or vomiting, and her membranes were intact.  When placed on monitor she was found to be contracting every three to six minutes apart with reassuring fetal heart rate tracing.  The cervix was found to be 3 cm, 80% effaced, and -2/3 station.  The patient was immediately admitted to labor and delivery, where she underwent artificial rupture of membranes with clear amniotic fluid.  Fetal scalp electrode and intrauterine pressure catheter were placed.  The cervix was 4 cm, 80% effaced, -2 station. Clear amniotic fluid was evident.  Reassuring fetal heart rate tracing. Contractions every five to six minutes apart.  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  The patient also had been treated for urinary tract infection during her pregnancy as well, and her GBS was negative.  ALLERGIES:                    SHRIMP.  PAST MEDICAL HISTORY:         1. Spontaneous AB at seven weeks gestation in                                  April 2000.                               2. Right breast excision of a mass during this      pregnancy which was benign, done by Dr.                                  Earvin Hansen L. [place].                               3. D&C in the past.  SOCIAL HISTORY:               She denies smoking or alcohol consumption.  REVIEW OF SYSTEMS:            See ______ form.  PHYSICAL EXAMINATION:  VITAL SIGNS:                  Admission blood pressure 126/100, on repeat 123/85.  Temperature 97.4 degrees.  Pulse 89.  Respirations 14.  HEENT:                        Unremarkable.  NECK:                         Supple.  Trachea midline.  No carotid bruits. No thyromegaly.  LUNGS:                        Clear to auscultation without rhonchi or wheezes.  HEART:                        Regular rate and rhythm.  No murmurs or gallops.  BREAST:                       Examination not done.  ABDOMEN:                      Gravid uterus.  Vertex presentation by [facility] Cushing maneuver.  Positive fetal heart tones.  PELVIC:                       Cervix 4 cm dilated, 80% effaced, -3 station.  EXTREMITIES:                  DTRs 1+.  Negative clonus.  Trace edema.  PRENATAL LABORATORY DATA:     Blood type O-positive.  Negative antibody screen.  Sickle cell trait negative.  VDRL, hepatitis B surface antigen, and HIV negative.  Rubella titer with evidence of immunity.  Pap smear had been normal.  Genetic amniocentesis, 75 XX.  Blood sugar screen normal.  GBS culture negative.  ASSESSMENT:                   This is a 45 year old gravida 2 para 0 abortus 1 at 40-2/7th weeks gestation, in labor, with advanced cervical dilatation, who underwent artificial rupture of membranes with clear amniotic fluid.  PLAN:                         IUPC and fetal scalp electrode were placed.  The patient had received 1 mg of Stadol with 12.5 mg Phenergan at approximately 0345 hours and is now requesting an epidural.  She is currently getting a fluid bolus prior to her epidural placement.  Fetal heart rate tracing  is reassuring.  Vital signs are stable.  The patient is afebrile.  Will augment with Pitocin once epidural is on board.  Plan is per assessment above. Dictated by:   Gaetano Hawthorne Lily Peer, M.D. Attending:  Gaetano Hawthorne. Lily Peer, M.D. DD:  04/02/01 TD:  04/02/01 Job: [id number]

## 2010-09-30 NOTE — Op Note (Signed)
Alexis Carr, Alexis Carr                         ACCOUNT NO.:  [id number]   MEDICAL RECORD NO.:  [id number]                   PATIENT TYPE:  INP   LOCATION:  9104                                 FACILITY:  [facility]   PHYSICIAN:  Timothy P. Fontaine, M.D.           DATE OF BIRTH:  1966-04-25   DATE OF PROCEDURE:  02/18/2003  DATE OF DISCHARGE:                                 OPERATIVE REPORT   PREOPERATIVE DIAGNOSES:  Twins 37 weeks; prior cesarean section, for repeat  cesarean section; labor.   POSTOPERATIVE DIAGNOSES:  Twins 37 weeks; prior cesarean section, for repeat  cesarean section; labor; malpresentation; Twin A frank breech; Twin B  transverse.   SURGEON:  Timothy P. Fontaine, M.D.   ASSISTANT:  Scrub technician.   SURGERY:  Repeat low transverse cervical cesarean section.   ANESTHETIC:  Spinal.   ESTIMATED BLOOD LOSS:  500 mL.   COMPLICATIONS:  None.   SPECIMENS:  Samples of cord blood, Twin A and Twin B, placenta and umbilical  cords.  Cord clamp applied to Twin B cord.   FINDINGS:  At 0954 hours Twin A, female, Apgars 9 and 9, weight 5 pounds 6  ounces, frank breech presentation.  At 0955 hours Twin B, female, Apgars 9 and  9, weight 5 pounds 7 ounces, transverse presentation.  Pelvic anatomy noted  to be normal.   PROCEDURE:  The patient was taken to the operating room and underwent spinal  anesthesia.  Was placed in the left tilt supine position and received an  abdominal preparation with Betadine solution.  The bladder was emptied with  an indwelling Foley catheterization, placed under sterile technique by the  nursing personnel.  The patient was draped in the usual fashion.  After  assuring adequate anesthesia, the abdomen was sharply entered through a  repeat Pfannenstiel incision, achieving adequate hemostasis at all levels.  The bladder flap was sharply and bluntly developed without difficulty.  The  uterus was sharply entered in the lower uterine segment and  bluntly extended  laterally.  The bulging membranes were ruptured and Twin A was found to be  in the frank breech presentation and underwent a breech extraction without  difficulty.  The nares and mouth were suctioned.  The cord was doubly  clamped and cut and the infant was handed to pediatrics in attendance.  Twin  B's bulging membranes were then ruptured and the twin was found to be in the  transverse presentation, which converted to a footling breech presentation  and underwent a breech extraction without difficulty.  A nuchal cord x 1 was  noted and was reduced.  The nares and mouth were suctioned.  The cord was  doubly clamped and cut and the infant was handed to pediatrics in  attendance.  Samples of cord blood from both umbilical cords were obtained.  The placenta was then spontaneously extruded and noted to be intact and was  sent to pathology.  The uterus was exteriorized.  The endometrial cavity was  explored with a sponge to remove all placental and membrane fragments.  The  patient received 1 g Ancef IV prophylaxis at this time.   The uterine incision was closed in one layer using 0 Vicryl suture in a  running interlocking stitch with one figure-of-eight subsequent suture  placed to achieve adequate hemostasis.  The uterus was returned to the  abdomen, which was copiously irrigated, adequate hemostasis visualized, and  the anterior fascia was reapproximated using 0 Vicryl suture in a running  stitch.  The subcutaneous tissues were irrigated.  Adequate hemostasis was  achieved with electrocautery.  The skin was reapproximated with staples.  Sterile dressing was applied.  The patient was taken to the recovery room in  good condition having tolerated the procedure well.                                               Timothy P. Audie Box, M.D.    TPF/MEDQ  D:  02/18/2003  T:  02/18/2003  Job:  [id number]

## 2010-09-30 NOTE — Discharge Summary (Signed)
   Alexis Carr, BOTSFORD                         ACCOUNT NO.:  [id number]   MEDICAL RECORD NO.:  [id number]                   PATIENT TYPE:  INP   LOCATION:  9104                                 [facility]ACILITY:  [facility]   PHYSICIAN:  Timothy P. Fontaine, M.D.           DATE OF BIRTH:  1965-09-04   DATE OF ADMISSION:  02/18/2003  DATE OF DISCHARGE:  02/21/2003                                 DISCHARGE SUMMARY   DISCHARGE DIAGNOSES:  1. Intrauterine pregnancy.  2. Twin gestation.  3. Delivered.  4. History of prior cesarean section for repeat cesarean section.  5. Labor.  6. Mild presentation.  7. Twin A frank breech, twin B vertex.  8. Status post repeat low transverse cesarean section by Dr. Colin Broach on February 18, 2003.   HISTORY OF PRESENT ILLNESS:  This is a 45 year old female gravida III, para  I with twin gestation who is followed during pregnancy with history of prior  cesarean section, desires repeat cesarean section, mal presentation of the  twins was noted as well.   HOSPITAL COURSE:  On February 18, 2003, the patient at 37 weeks awoke at a.m.  in labor and was found to be 5 cm dilated in triage and therefore was  admitted and underwent a repeat low transverse cesarean section and  underwent delivery of a female who was frank breech, Apgars 9 and 9, weight of  5 pounds 6 ounces, and a second female who was transverse.  Apgars 9 and 9,  weight 5 pounds 7 ounces.  There were no complications.  Postoperatively,  the patient remained afebrile and in stable condition.  She did state she  had complaints of upper lateral side numbness and was evaluated with  anesthesia, was thought to be the fault.  It should improve within 10 days  postoperatively.  Felt stable for discharge on February 21, 2003, and given  [facility] post delivery, postpartum booklet.   PHYSICAL EXAMINATION:  No findings.   LABORATORY DATA:  The patient is O positive, rubella immune.  Hemoglobin on  February 19, 2003, was 8.5.   DISPOSITION:  The patient was discharged to home, return to the office in  six weeks, and if having any problems come to the office and script for  Tylox p.r.n. pain.  She was informed by anesthesia, if the numbness did not  improve in 10 days to call them for evaluation.     Susa Loffler, P.A.                    Timothy P. Audie Box, M.D.    Ardath Sax  D:  03/13/2003  T:  03/13/2003  Job:  [id number]

## 2010-09-30 NOTE — Discharge Summary (Signed)
   NAME:  Alexis Carr, Alexis Carr                         ACCOUNT NO.:  [id number]   MEDICAL RECORD NO.:  [id number]                   PATIENT TYPE:  OBV   LOCATION:  9156                                 FACILITY:  [facility]   PHYSICIAN:  Ivor Costa. Farrel Gobble, M.D.              DATE OF BIRTH:  1965/05/19   DATE OF ADMISSION:  02/02/2003  DATE OF DISCHARGE:  02/02/2003                                 DISCHARGE SUMMARY   DISCHARGE DIAGNOSES:  1. Intrauterine pregnancy, twins, 34+ weeks, not delivered.  2. Advanced maternal age.  3. Prior cesarean section.  4. Audible deceleration of twin B in office.   HISTORY OF PRESENT ILLNESS:  This is a 45 year old female, gravida 3, para 1  with EDC of March 09, 2003.  Prenatal course was complicated by advanced  maternal age, prior cesarean section, and twin gestation spontaneous.  She  was seen for a routine OB visit on February 02, 2003, when audible  deceleration of twin B was severely detected and patient was transferred to  [facility] for NST of twins.   HOSPITAL COURSE:  On February 02, 2003, patient 34+ weeks with twins,  reported accelerations with variable decelerations on twin B.  Biophysical  profile was 8/8 for each with adequate fluid for each.  Cervix 3 cm and  closed.  The patient transferred to antepartum for prolonged monitoring.  Both twins were reactive but occasional variables on twin B with no signs of  fetal distress.  The patient was monitored for a total of two hours without  any signs of fetal distress, therefore, the patient was thought in stable  condition for discharge to home.   DISCHARGE INSTRUCTIONS:  The patient was to be seen on Thursday for a  nonstress test a.m.  If she had any problems prior to that time, she will be  seen in the office.     Susa Loffler, P.A.                    Ivor Costa. Farrel Gobble, M.D.    TSG/MEDQ  D:  02/26/2003  T:  02/26/2003  Job:  [id number]

## 2010-09-30 NOTE — Op Note (Signed)
[facility]  Patient:    Alexis Carr, Alexis Carr Visit Number: [id number] MRN: [id number]          Service Type: OBS Location: 910A 9113 01 Attending Physician:  Tonye Royalty Dictated by:   Douglass Rivers, M.D. Proc. Date: 04/02/01 Admit Date:  04/02/2001                             Operative Report  PREOPERATIVE DIAGNOSIS:       Chorioamnionitis.  POSTOPERATIVE DIAGNOSIS:      Chorioamnionitis.  PROCEDURE:                    Primary cesarean section, low flap transverse.  SURGEON:                      Douglass Rivers, M.D.  ASSISTANT:                    Scrub tech Rolan Bucco.  ANESTHESIA:                   Epidural.  IV FLUIDS:                    1700 cc lactated Ringers.  ESTIMATED BLOOD LOSS:         300 cc.  URINE OUTPUT:                 500 cc clear urine.  FINDINGS:                     Viable female infant in the vertex presentation. Late onset of thin meconium.  DeLee suctioned for clear fluid.  Birth weight was 7 lb 9 oz.  Apgars were 9 and 9.  Cord pH was 7.3.  Normal uterus, rubes and ovaries.  PATHOLOGY:                    Placenta.  COMPLICATIONS:                None.  DESCRIPTION OF PROCEDURE:     The patient was taken to the operating room and placed in the supine position with left lateral displacement.  She was prepped and draped in the usual sterile fashion.  A Pfannenstiel skin incision was made with a scalpel after adequate anesthesia was ensured and carried through the underlying layer of fascia with electrocautery.  The fascia was scored in the midline and the incision was extended laterally with the Mayo scissors. the inferior aspect of the fascial incision was grasped with Kochers.  The underlying rectus muscles were dissected off with blunt and sharp dissection. In a similar fashion, the superior aspect of the muscles were dissected off. The rectus muscles were separated in the midline.  The peritoneum  was identified and entered bluntly.  The peritoneal incision was then extended superiorly and inferiorly with good visualization of the underlying bowel and bladder.  The bladder was noted to be markedly distended, obstructing the incision.  Despite the fact that the Foley had been placed preoperatively, there was only a small amount of urine in the tube.  The Foley eventually was removed and replaced and the bladder decompressed for 400 cc of clear urine. At this point, a bladder blade was inserted.  The vesicouterine peritoneum was identified and entered sharply with Metzenbaums.  A bladder flap was created digitally.  The bladder blade was then reinserted and the lower uterine segment was incised in a transverse fashion with a scalpel.  The lower uterine segment was noted to be markedly thin.  The incision was extended laterally with Metzenbaum scissors.  Very thin meconium was noted upon entering the uterine cavity.  The infant was delivered from the vertex presentation and DeLee suctioned prior to delivery of the remaining body.  The cord was clamped and cut and the infant was handed off to the awaiting pediatricians.  Cord blood and pH were obtained.  The uterus was massaged and the placenta was allowed to separate naturally.  Of note, there was a marked amount of membranes adherent to the uterus, which were carefully taken out.  The placenta was sent for pathology.  The uterus was then cleared of all clots and debris.  The uterus was repaired in a running locked layer of #1 chromic and was noted to be hemostatic.  The pelvis was irrigated.  The gutters were cleared of all clots and debris.  The adnexa were inspected.  The incision was also noted to be hemostatic, as was the peritoneum and muscles.  The fascia was then closed with 0 Vicryl in a running fashion.  The subcu was irrigated. the skin was closed with staples.  The patient tolerated the procedure well. Sponge, lap and  instrument counts were correct x 2.  She was taken to the PACU in stable condition.  She had received Ampicillin as well as gentamicin preoperatively. Dictated by:   Douglass Rivers, M.D. Attending Physician:  Tonye Royalty DD:  04/02/01 TD:  04/03/01 Job: [id number]

## 2010-09-30 NOTE — Op Note (Signed)
[place]  Patient:    Alexis Carr, Alexis Carr                      MRN: [id number] Proc. Date: 01/22/00 Adm. Date:  [id number] Attending:  Gustavus Messing                           Operative Report  INDICATIONS:  This 45 year old lady has severe macromastia, back and shoulder pain secondary to large pendulous breasts.  PROCEDURE PLANNED:  Bilateral breast reduction and excision of accessory breast tissue.  SURGEON:  Yaakov Guthrie. Shon Hough, M.D.  ANESTHESIA:  General.  DESCRIPTION OF PROCEDURE:  Preoperatively the patient was set-up and drawn for the inferior pedicle reduction mammoplasty. Reworking the nipple areolar complex to 20 cm from the suprasternal notch. She then underwent general anesthesia and was intubated orally. Preparation was done to the chest/breast areas in routine fashion using Betadine sulfa solution, walled off with sterile towels and draped so as to make a sterile field. 0.25% Xylocaine with epinephrine was injected locally for vasoconstriction, approximately 100 cc per side. The wounds were scored with a #15 blade and then the skin over the inferior pedicle was epithelialized using a #20 blade. The medial and lateral and inferior pedicles were incised on the underlying fascia. Hemostasis was maintained with the Bovie unit on coagulation. After this more tissue was taken laterally to improve the symmetry, and also to remove accessory breast tissue. After proper hemostasis the new key-hole area was debulked and then the flaps were transferred and steadied with 3-0 Prolene. Subcutaneous closure was done with 3-0 Monocryl x 2 layers, and then a running subcuticular stitch with 3-0 Monocryl and 5-0 Monocryl throughout the inverted T. The wounds were drained with a #10 Blake drain and then secured with 3-0 Prolene. The wounds were cleansed. Steri-Strips and soft dressings were applied, including; Xeroform, 4 x 4s, ABDs and Hypafix  tape. We removed over 980 grams per side. She withstood the procedures very well. The nipple areolar complexes were with good blood supply at the end of the procedure, and supple. She was then taken to recovery. DD:  01/23/00 TD:  01/23/00 Job: [id number]

## 2010-10-25 ENCOUNTER — Other Ambulatory Visit: Payer: Self-pay

## 2010-10-25 MED ORDER — METHYLPHENIDATE HCL ER (OSM) 54 MG PO TBCR: 54.0000 mg | EXTENDED_RELEASE_TABLET | ORAL | Status: DC

## 2010-11-23 ENCOUNTER — Telehealth: Payer: Self-pay | Admitting: Internal Medicine

## 2010-11-23 MED ORDER — METHYLPHENIDATE HCL ER (OSM) 54 MG PO TBCR: 54.0000 mg | EXTENDED_RELEASE_TABLET | ORAL | Status: DC

## 2010-11-23 NOTE — Telephone Encounter (Signed)
Pt aware rx ready

## 2010-12-27 ENCOUNTER — Other Ambulatory Visit: Payer: Self-pay | Admitting: Internal Medicine

## 2010-12-27 MED ORDER — METHYLPHENIDATE HCL ER (OSM) 54 MG PO TBCR: 54.0000 mg | EXTENDED_RELEASE_TABLET | ORAL | Status: DC

## 2010-12-27 NOTE — Telephone Encounter (Signed)
Pt aware rx ready for pick up. 

## 2011-01-03 ENCOUNTER — Telehealth: Payer: Self-pay | Admitting: *Deleted

## 2011-01-03 NOTE — Telephone Encounter (Signed)
Noted, I had extensive discussion about the need for a colonoscopy at the time of her visit . I'll  discussed again when she comes back

## 2011-01-03 NOTE — Telephone Encounter (Signed)
Patient 'No Show' with Dr. Dulce Sellar at [facility]

## 2011-02-08 ENCOUNTER — Other Ambulatory Visit: Payer: Self-pay | Admitting: Internal Medicine

## 2011-02-08 NOTE — Telephone Encounter (Signed)
Refill concerta cbs patient will pick up Friday [id number]

## 2011-02-08 NOTE — Telephone Encounter (Signed)
Concerta request [last refill 12/27/10 #30x0 last OV 09/05/10]

## 2011-02-08 NOTE — Telephone Encounter (Signed)
Ok  #30, no refills. Please remind patient she is due for a visit next month

## 2011-02-09 MED ORDER — METHYLPHENIDATE HCL ER (OSM) 54 MG PO TBCR: 54.0000 mg | EXTENDED_RELEASE_TABLET | ORAL | Status: DC

## 2011-02-09 NOTE — Telephone Encounter (Signed)
LMOM to inform patient Rx is ready for P/U at front office M-F 8am-5pm.

## 2011-02-09 NOTE — Telephone Encounter (Signed)
Rx printed, awaiting MD signature.  

## 2011-03-06 ENCOUNTER — Encounter: Payer: Self-pay | Admitting: Internal Medicine

## 2011-03-06 ENCOUNTER — Ambulatory Visit (INDEPENDENT_AMBULATORY_CARE_PROVIDER_SITE_OTHER): Payer: BLUE CROSS/BLUE SHIELD | Admitting: Internal Medicine

## 2011-03-06 DIAGNOSIS — F988 Other specified behavioral and emotional disorders with onset usually occurring in childhood and adolescence: Secondary | ICD-10-CM

## 2011-03-06 MED ORDER — METHYLPHENIDATE HCL ER (OSM) 36 MG PO TBCR: 36.0000 mg | EXTENDED_RELEASE_TABLET | ORAL | Status: DC

## 2011-03-06 NOTE — Assessment & Plan Note (Signed)
See HPI, will decrease dose of concerta b/c she feels slt jittery, will call if that does not work, consider readjust dose or change med

## 2011-03-06 NOTE — Progress Notes (Signed)
  Subjective:    Patient ID: Alexis Carr, female    DOB: 09-Aug-1965, 45 y.o.   MRN: [id number]  HPI Here for RF of Concerta It works well but make her slt jittery and tends to over react (according to her husband): should she be on a lower dose?  Past Medical History  Diagnosis Date  . ADD (attention deficit disorder)   . 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 Anemia    Past Surgical History  Procedure Date  . Cesarean section     x2  . Cystectomy     wrist   . Tumor removal     Uterine Fibroid    Review of Systems No anxiety perse or depression  Rarely has insomnia     Objective:   Physical Exam  Constitutional: She is oriented to person, place, and time. She appears well-developed. No distress.  HENT:  Head: Normocephalic and atraumatic.  Neurological: She is alert and oriented to person, place, and time.  Skin: Skin is warm and dry. She is not diaphoretic.  Psychiatric: She has a normal mood and affect. Her behavior is normal. Judgment and thought content normal.          Assessment & Plan:

## 2011-03-06 NOTE — Patient Instructions (Addendum)
Try the new concerta 36, let me know if it works for you

## 2011-04-24 ENCOUNTER — Other Ambulatory Visit: Payer: Self-pay | Admitting: Internal Medicine

## 2011-04-25 MED ORDER — METHYLPHENIDATE HCL ER (OSM) 36 MG PO TBCR: 36.0000 mg | EXTENDED_RELEASE_TABLET | ORAL | Status: DC

## 2011-04-25 NOTE — Telephone Encounter (Signed)
Pt aware Rx ready for pick up 

## 2011-06-02 ENCOUNTER — Other Ambulatory Visit: Payer: Self-pay

## 2011-06-02 MED ORDER — METHYLPHENIDATE HCL ER (OSM) 36 MG PO TBCR: 36.0000 mg | EXTENDED_RELEASE_TABLET | ORAL | Status: DC

## 2011-06-02 NOTE — Telephone Encounter (Signed)
Okay to call #30, no RF , please clarify the dose: 36 mg? 54 mg?

## 2011-06-02 NOTE — Telephone Encounter (Addendum)
Pt is requesting a refill of Concerta 36 mg.  Pt last seen 03/06/11. Pls advise.

## 2011-06-02 NOTE — Telephone Encounter (Signed)
Left a message making pt aware rx ready for pickup.   Verified 36 mg as last rx.

## 2011-06-20 ENCOUNTER — Other Ambulatory Visit: Payer: Self-pay | Admitting: *Deleted

## 2011-06-20 MED ORDER — METHYLPHENIDATE HCL ER (OSM) 36 MG PO TBCR: 36.0000 mg | EXTENDED_RELEASE_TABLET | ORAL | Status: DC

## 2011-08-30 ENCOUNTER — Telehealth: Payer: Self-pay | Admitting: *Deleted

## 2011-08-30 MED ORDER — METHYLPHENIDATE HCL ER (OSM) 36 MG PO TBCR: 36.0000 mg | EXTENDED_RELEASE_TABLET | ORAL | Status: DC

## 2011-08-30 NOTE — Telephone Encounter (Signed)
Refill done.  

## 2011-09-05 ENCOUNTER — Telehealth: Payer: Self-pay | Admitting: *Deleted

## 2011-09-05 NOTE — Telephone Encounter (Signed)
Prior Auth Approved 08-15-11 until 09-04-12, pharmacy notified via fax.

## 2011-09-06 ENCOUNTER — Telehealth: Payer: Self-pay

## 2011-09-06 NOTE — Telephone Encounter (Signed)
PA ---Concerta 36 mg is approved from 08/15/11 until 09/04/2012. Case ID #[id number]-- letter sent to be scanned     KP

## 2015-05-06 DIAGNOSIS — D509 Iron deficiency anemia, unspecified: Secondary | ICD-10-CM | POA: Diagnosis not present

## 2017-05-25 NOTE — Progress Notes (Signed)
Office Visit Note  Patient: Alexis Carr             Date of Birth: Oct 23, 1965           MRN: [id number]             PCP: Ernestene Kiel, MD Referring: Ernestene Kiel, MD Visit Date: 05/31/2017 Occupation: Teacher    Subjective:  Pain in multiple joints   History of Present Illness: Alexis Carr is a 52 y.o. female seen in consultation per request of her PCP. According to patient her symptoms started a few years back but knee joint pain. She states she has discomfort in her knees off and on. In June 2018 she started having increased lower back pain and severe lower back pain. She was seen by her PCP and had MRI which showed bulging disc. She states she has had right-sided radiculopathy since then. She also has difficulty driving because of that. She was treated with prednisone and tramadol combination which improved her symptoms to some extent. In September 2018 she developed plantar fasciitis in her bilateral feet. Her right foot was more symptomatic than her left. She took another course of prednisone for one week for plantar fasciitis. She has not noticed much improvement in her plantar fasciitis symptoms. None of the other joints are painful or swollen. She states she has chronic stiffness in her C-spine. She denies any history of psoriasis.  Activities of Daily Living:  Patient reports morning stiffness for 1 hour.   Patient Denies nocturnal pain.  Difficulty dressing/grooming: Denies Difficulty climbing stairs: Reports Difficulty getting out of chair: Reports Difficulty using hands for taps, buttons, cutlery, and/or writing: Denies   Review of Systems  Constitutional: Positive for fatigue. Negative for night sweats, weight gain, weight loss and weakness.  HENT: Negative for mouth sores, trouble swallowing, trouble swallowing, mouth dryness and nose dryness.   Eyes: Negative for pain, redness, visual disturbance and dryness.  Respiratory: Negative for cough,  shortness of breath and difficulty breathing.   Cardiovascular: Negative for chest pain, palpitations, hypertension, irregular heartbeat and swelling in legs/feet.  Gastrointestinal: Negative for blood in stool, constipation and diarrhea.  Endocrine: Negative for increased urination.  Genitourinary: Negative for vaginal dryness.  Musculoskeletal: Positive for arthralgias, joint pain and morning stiffness. Negative for joint swelling, myalgias, muscle weakness, muscle tenderness and myalgias.  Skin: Positive for rash. Negative for color change, hair loss, skin tightness, ulcers and sensitivity to sunlight.  Allergic/Immunologic: Negative for susceptible to infections.  Neurological: Negative for dizziness, memory loss and night sweats.  Hematological: Negative for swollen glands.  Psychiatric/Behavioral: Negative for depressed mood and sleep disturbance. The patient is nervous/anxious.     PMFS History:  Patient Active Problem List   Diagnosis Date Noted  . 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 Vitamin D deficiency 05/31/2017  . Iron deficiency anemia 05/31/2017  . Bilateral plantar fasciitis 05/31/2017  . DDD (degenerative disc disease), lumbar 05/31/2017  . General medical examination 09/05/2010  . Attention deficit disorder 07/21/2008    Past Medical History:  Diagnosis Date  . ADD (attention deficit disorder)   . Anemia   . 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 Coronary artery disease Unknown        GM  . Hypertension Unknown  GM,GF  . Stroke Unknown        GM  . Throat cancer Unknown        GF  . Diabetes Neg Hx   . Colon cancer Neg Hx   . Breast cancer Neg Hx    Past Surgical History:  Procedure Laterality Date  . CESAREAN SECTION     x2  . CYSTECTOMY     wrist   . FOOT SURGERY Right    screw in great toe   . TUMOR  REMOVAL     Uterine Fibroid   Social History   Social History Narrative         Objective: Vital Signs: BP 130/76 (BP Location: Left Arm, Patient Position: Sitting, Cuff Size: Normal)   Pulse 83   Resp 16   Ht 5' 5.5" (1.664 m)   Wt 228 lb (103.4 kg)   BMI 37.36 kg/m    Physical Exam  Constitutional: She is oriented to person, place, and time. She appears well-developed and well-nourished.  HENT:  Head: Normocephalic and atraumatic.  Eyes: Conjunctivae and EOM are normal.  Neck: Normal range of motion.  Cardiovascular: Normal rate, regular rhythm, normal heart sounds and intact distal pulses.  Pulmonary/Chest: Effort normal and breath sounds normal.  Abdominal: Soft. Bowel sounds are normal.  Lymphadenopathy:    She has no cervical adenopathy.  Neurological: She is alert and oriented to person, place, and time.  Skin: Skin is warm and dry. Capillary refill takes less than 2 seconds.  Psychiatric: She has a normal mood and affect. Her behavior is normal.  Nursing note and vitals reviewed.    Musculoskeletal Exam: C-spine limited range of motion of his discomfort. She had discomfort range of motion of her lumbar spine. She had bilateral trapezius spasm. Shoulder joints elbow joints wrist joints MCPs PIPs DIPs with good range of motion with no synovitis. She had limited range of motion of her bilateral hip joints with discomfort. She had tenderness over bilateral trochanteric bursa area. Her knee joints are good range of motion without any warmth swelling or effusion which she had discomfort range of motion of bilateral knee joints. She is tenderness over bilateral plantar fascia. No synovitis was noted over her MTPs PIPs or DIPs.  CDAI Exam: No CDAI exam completed.    Investigation: No additional findings.  Labs on 03/22/17 RF 19.4, ESR 53, TSH normal  CBC Latest Ref Rng & Units 08/26/2010 04/02/2009 08/07/2008  WBC 4.5 - 10.5 K/uL 5.9 6.2 5.3  Hemoglobin 12.0 - 15.0 g/dL  11.9(L) 11.5(L) 11.7(L)  Hematocrit 36.0 - 46.0 % 35.0(L) 35.6(L) 36.7  Platelets 150.0 - 400.0 K/uL 166.0 171 180   CMP Latest Ref Rng & Units 08/26/2010 08/07/2008 06/13/2007  Glucose 70 - 99 mg/dL 73 88 80  BUN 6 - 23 mg/dL _0 Creatinine 0.4 - 1.2 mg/dL 0.5 0.63 0.59  Sodium 135 - 145 mEq/L 138 138 -  Potassium 3.5 - 5.1 mEq/L 4.2 4.2 -  Chloride 96 - 112 mEq/L 103 105 -  CO2 19 - 32 mEq/L 30 26 -  Calcium 8.4 - 10.5 mg/dL 9.3 9.6 -  Total Protein 6.0 - 8.3 g/dL 7.4 - -  Total Bilirubin 0.3 - 1.2 mg/dL 0.6 - 1.0  Alkaline Phos 39 - 117 U/L 74 - -  AST 0 - 37 U/L _1 ALT 0 - 35 U/L _2 Imaging: Xr Hip Unilat W Or W/o Pelvis  2-3 Views Left  Result Date: 05/31/2017  No significant joint space narrowing was noted. Inferior and superior spurs were noted.  Xr Hip Unilat W Or W/o Pelvis 2-3 Views Right  Result Date: 05/31/2017 No joint space narrowing was noted. Superior and inferior spurs were noted.  Xr Cervical Spine 2 Or 3 Views  Result Date: 05/31/2017 Large osteophytes noted at C5-6 region. Mild is spondylosis noted and facet joint arthropathy noted. Impression: These findings are consistent with spondylosis.  Xr Foot 2 Views Left  Result Date: 05/31/2017 PIP/DIP and first MTP narrowing was noted. No erosive changes were noted. A small inferior calcaneal and posterior calcaneal spur was noted. Impression: These findings are consistent osteoarthritis of the foot.  Xr Foot 2 Views Right  Result Date: 05/31/2017 First MTP narrowing with the screw placement was noted. All PIP/DIP narrowing was noted. The posterior calcaneal spur was noted. Impression: These findings are consistent with osteoarthritis of the foot.  Xr Knee 3 View Left  Result Date: 05/31/2017 Moderate to severe medial compartment narrowing without chondrocalcinosis. Severe patellofemoral narrowing was noted. 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  Xr  Knee 3 View Right  Result Date: 05/31/2017 Moderate medial compartment narrowing with intercondylar osteophytes, no chondrocalcinosis was noted. Moderate patellofemoral narrowing was noted. Impression: Moderate osteoarthritis and moderate chondromalacia patella.   Speciality Comments: No specialty comments available.    Procedures:  No procedures performed Allergies: Patient has no known allergies.   Assessment / Plan:     Visit Diagnoses: Polyarthralgia - RF 19.4, ESR 53 -patient has no synovitis on examination. I would like to obtain following labs today. Plan: Sedimentation rate, Rheumatoid factor, Cyclic citrul peptide antibody, IgG, 14-3-3 eta Protein, HLA-B27 antigen  Neck pain -she has limited range of motion of her C-spine with the stiffness in bilateral trapezius is spasm. Plan: XR Cervical Spine 2 or 3 views. She had large anterior osteophyte at C5-6. At this point she is not having any dysphagia.  Chronic pain of both hips -she has limited range of motion of bilateral hip joints. Plan: XR HIP UNILAT W OR W/O PELVIS 2-3 VIEWS RIGHT, XR HIP UNILAT W OR W/O PELVIS 2-3 VIEWS LEFT she is spurs in her hip x-rays which could be limiting her range of motion.  Trochanteric bursitis of both hips: She is tenderness over bilateral trochanteric bursa. I will refer to physical therapy for trochanteric bursitis. This is keeping her from doing her routine activities.  Chronic pain of both knees - Plan: XR KNEE 3 VIEW RIGHT, XR KNEE 3 VIEW LEFT. She had moderate osteoarthritis in bilateral knee joints with moderate to severe chondromalacia patella.  Pain in both feet - Plan: XR Foot 2 Views Right, XR Foot 2 Views Left. X-rays reveal osteoarthritis and calcaneal spurs.  Bilateral plantar fasciitis: She tenderness over bilateral plantar fascia. I will refer to physical therapy for plantar fasciitis.  DDD (degenerative disc disease), lumbar - MRI 10/2016: L3-L4 and L4-L5 bulging discs, worse at  L3-L4 with rightward deviation and minimal formainal narrowing  Other fatigue - Plan: CBC with Differential/Platelet, COMPLETE METABOLIC PANEL WITH GFR, Urinalysis, Routine w reflex microscopic, CK, VITAMIN D 25 Hydroxy (Vit-D Deficiency, Fractures), Hepatitis B core antibody, IgM, Hepatitis B surface antigen, Hepatitis C antibody, Glucose 6 phosphate dehydrogenase, Serum protein electrophoresis with reflex, IgG, IgA, IgM  Attention deficit disorder, unspecified hyperactivity presence  Iron deficiency anemia, unspecified iron deficiency anemia type  Vitamin D deficiency - Vitamin D 21.7  Nontoxic goiter -  TSH normal on 03/22/17    Orders: Orders Placed This Encounter  Procedures  . XR Cervical Spine 2 or 3 views  . XR KNEE 3 VIEW RIGHT  . XR KNEE 3 VIEW LEFT  . XR HIP UNILAT W OR W/O PELVIS 2-3 VIEWS RIGHT  . XR HIP UNILAT W OR W/O PELVIS 2-3 VIEWS LEFT  . XR Foot 2 Views Right  . XR Foot 2 Views Left  . CBC with Differential/Platelet  . COMPLETE METABOLIC PANEL WITH GFR  . Urinalysis, Routine w reflex microscopic  . CK  . Sedimentation rate  . VITAMIN D 25 Hydroxy (Vit-D Deficiency, Fractures)  . Rheumatoid factor  . Cyclic citrul peptide antibody, IgG  . 14-3-3 eta Protein  . HLA-B27 antigen  . Hepatitis B core antibody, IgM  . Hepatitis B surface antigen  . Hepatitis C antibody  . Glucose 6 phosphate dehydrogenase  . Serum protein electrophoresis with reflex  . IgG, IgA, IgM  . Ambulatory referral to Physical Therapy   No orders of the defined types were placed in this encounter.   Face-to-face time spent with patient was 50 minutes. Greater than 50% of time was spent in counseling and coordination of care.  Follow-Up Instructions: Return for Polyarthralgia.   Bo Merino, MD  Note - This record has been created using Editor, commissioning.  Chart creation errors have been sought, but may not always  have been located. Such creation errors do not reflect on  the  standard of medical care.

## 2017-05-31 ENCOUNTER — Ambulatory Visit (INDEPENDENT_AMBULATORY_CARE_PROVIDER_SITE_OTHER): Payer: Self-pay

## 2017-05-31 ENCOUNTER — Ambulatory Visit: Payer: BC Managed Care – PPO | Admitting: Rheumatology

## 2017-05-31 ENCOUNTER — Encounter (INDEPENDENT_AMBULATORY_CARE_PROVIDER_SITE_OTHER): Payer: Self-pay

## 2017-05-31 ENCOUNTER — Encounter: Payer: Self-pay | Admitting: Rheumatology

## 2017-05-31 VITALS — BP 130/76 | HR 83 | Resp 16 | Ht 65.5 in | Wt 228.0 lb

## 2017-05-31 DIAGNOSIS — M545 Low back pain, unspecified: Secondary | ICD-10-CM

## 2017-05-31 DIAGNOSIS — M51369 Other intervertebral disc degeneration, lumbar region without mention of lumbar back pain or lower extremity pain: Secondary | ICD-10-CM | POA: Insufficient documentation

## 2017-05-31 DIAGNOSIS — D509 Iron deficiency anemia, unspecified: Secondary | ICD-10-CM | POA: Diagnosis not present

## 2017-05-31 DIAGNOSIS — M25562 Pain in left knee: Secondary | ICD-10-CM | POA: Diagnosis not present

## 2017-05-31 DIAGNOSIS — M79671 Pain in right foot: Secondary | ICD-10-CM

## 2017-05-31 DIAGNOSIS — M7061 Trochanteric bursitis, right hip: Secondary | ICD-10-CM | POA: Diagnosis not present

## 2017-05-31 DIAGNOSIS — M542 Cervicalgia: Secondary | ICD-10-CM

## 2017-05-31 DIAGNOSIS — M7062 Trochanteric bursitis, left hip: Secondary | ICD-10-CM | POA: Diagnosis not present

## 2017-05-31 DIAGNOSIS — M2550 Pain in unspecified joint: Secondary | ICD-10-CM

## 2017-05-31 DIAGNOSIS — M5136 Other intervertebral disc degeneration, lumbar region: Secondary | ICD-10-CM | POA: Insufficient documentation

## 2017-05-31 DIAGNOSIS — M25551 Pain in right hip: Secondary | ICD-10-CM | POA: Diagnosis not present

## 2017-05-31 DIAGNOSIS — M25552 Pain in left hip: Secondary | ICD-10-CM

## 2017-05-31 DIAGNOSIS — R5383 Other fatigue: Secondary | ICD-10-CM

## 2017-05-31 DIAGNOSIS — M79672 Pain in left foot: Secondary | ICD-10-CM

## 2017-05-31 DIAGNOSIS — M722 Plantar fascial fibromatosis: Secondary | ICD-10-CM | POA: Diagnosis not present

## 2017-05-31 DIAGNOSIS — E559 Vitamin D deficiency, unspecified: Secondary | ICD-10-CM | POA: Diagnosis not present

## 2017-05-31 DIAGNOSIS — M25561 Pain in right knee: Secondary | ICD-10-CM

## 2017-05-31 DIAGNOSIS — E049 Nontoxic goiter, unspecified: Secondary | ICD-10-CM | POA: Insufficient documentation

## 2017-05-31 DIAGNOSIS — F988 Other specified behavioral and emotional disorders with onset usually occurring in childhood and adolescence: Secondary | ICD-10-CM

## 2017-05-31 DIAGNOSIS — G8929 Other chronic pain: Secondary | ICD-10-CM

## 2017-06-01 NOTE — Progress Notes (Signed)
Will discuss at fu visit

## 2017-06-04 LAB — URINALYSIS, ROUTINE W REFLEX MICROSCOPIC
BILIRUBIN URINE: NEGATIVE
GLUCOSE, UA: NEGATIVE
HGB URINE DIPSTICK: NEGATIVE
Ketones, ur: NEGATIVE
Leukocytes, UA: NEGATIVE
Nitrite: NEGATIVE
PROTEIN: NEGATIVE
Specific Gravity, Urine: 1.02 (ref 1.001–1.03)
pH: 6 (ref 5.0–8.0)

## 2017-06-04 LAB — CBC WITH DIFFERENTIAL/PLATELET
BASOS ABS: 32 {cells}/uL (ref 0–200)
Basophils Relative: 0.6 %
EOS PCT: 0.9 %
Eosinophils Absolute: 49 cells/uL (ref 15–500)
HEMATOCRIT: 34.6 % — AB (ref 35.0–45.0)
Hemoglobin: 11.6 g/dL — ABNORMAL LOW (ref 11.7–15.5)
LYMPHS ABS: 1577 {cells}/uL (ref 850–3900)
MCH: 29.5 pg (ref 27.0–33.0)
MCHC: 33.5 g/dL (ref 32.0–36.0)
MCV: 88 fL (ref 80.0–100.0)
MPV: 10.8 fL (ref 7.5–12.5)
Monocytes Relative: 9.4 %
NEUTROS PCT: 59.9 %
Neutro Abs: 3235 cells/uL (ref 1500–7800)
Platelets: 170 10*3/uL (ref 140–400)
RBC: 3.93 10*6/uL (ref 3.80–5.10)
RDW: 13 % (ref 11.0–15.0)
Total Lymphocyte: 29.2 %
WBC mixed population: 508 cells/uL (ref 200–950)
WBC: 5.4 10*3/uL (ref 3.8–10.8)

## 2017-06-04 LAB — COMPLETE METABOLIC PANEL WITH GFR
AG RATIO: 1.4 (calc) (ref 1.0–2.5)
ALKALINE PHOSPHATASE (APISO): 86 U/L (ref 33–130)
ALT: 14 U/L (ref 6–29)
AST: 19 U/L (ref 10–35)
Albumin: 4.2 g/dL (ref 3.6–5.1)
BILIRUBIN TOTAL: 0.8 mg/dL (ref 0.2–1.2)
BUN: 11 mg/dL (ref 7–25)
CHLORIDE: 103 mmol/L (ref 98–110)
CO2: 28 mmol/L (ref 20–32)
Calcium: 9.1 mg/dL (ref 8.6–10.4)
Creat: 0.64 mg/dL (ref 0.50–1.05)
GFR, Est African American: 120 mL/min/{1.73_m2} (ref >=60)
GFR, Est Non African American: 103 mL/min/{1.73_m2} (ref >=60)
GLOBULIN: 2.9 g/dL (ref 1.9–3.7)
Glucose, Bld: 81 mg/dL (ref 65–99)
Potassium: 3.9 mmol/L (ref 3.5–5.3)
SODIUM: 137 mmol/L (ref 135–146)
Total Protein: 7.1 g/dL (ref 6.1–8.1)

## 2017-06-04 LAB — PROTEIN ELECTROPHORESIS, SERUM, WITH REFLEX
ALBUMIN ELP: 4 g/dL (ref 3.8–4.8)
Alpha 1: 0.3 g/dL (ref 0.2–0.3)
Alpha 2: 0.8 g/dL (ref 0.5–0.9)
BETA 2: 0.4 g/dL (ref 0.2–0.5)
BETA GLOBULIN: 0.4 g/dL (ref 0.4–0.6)
Gamma Globulin: 1.4 g/dL (ref 0.8–1.7)
TOTAL PROTEIN: 7.4 g/dL (ref 6.1–8.1)

## 2017-06-04 LAB — HEPATITIS B CORE ANTIBODY, IGM: HEP B C IGM: NONREACTIVE

## 2017-06-04 LAB — HLA-B27 ANTIGEN: HLA-B27 ANTIGEN: NEGATIVE

## 2017-06-04 LAB — HEPATITIS C ANTIBODY
Hepatitis C Ab: NONREACTIVE
SIGNAL TO CUT-OFF: 0.03 (ref <1.00)

## 2017-06-04 LAB — IGG, IGA, IGM
IGM, SERUM: 111 mg/dL (ref 48–271)
IgG (Immunoglobin G), Serum: 1401 mg/dL (ref 694–1618)
Immunoglobulin A: 221 mg/dL (ref 81–463)

## 2017-06-04 LAB — CYCLIC CITRUL PEPTIDE ANTIBODY, IGG: CYCLIC CITRULLIN PEPTIDE AB: 16 U

## 2017-06-04 LAB — RHEUMATOID FACTOR: Rhuematoid fact SerPl-aCnc: 15 IU/mL — ABNORMAL HIGH (ref <14)

## 2017-06-04 LAB — 14-3-3 ETA PROTEIN: 14-3-3 eta Protein: <0.2 ng/mL (ref <0.2)

## 2017-06-04 LAB — GLUCOSE 6 PHOSPHATE DEHYDROGENASE: G-6PDH: 10.8 U/g Hgb (ref 7.0–20.5)

## 2017-06-04 LAB — HEPATITIS B SURFACE ANTIGEN: HEP B S AG: NONREACTIVE

## 2017-06-04 LAB — CK: CK TOTAL: 52 U/L (ref 29–143)

## 2017-06-04 LAB — VITAMIN D 25 HYDROXY (VIT D DEFICIENCY, FRACTURES): VIT D 25 HYDROXY: 30 ng/mL (ref 30–100)

## 2017-06-04 LAB — SEDIMENTATION RATE: SED RATE: 41 mm/h — AB (ref 0–30)

## 2017-06-04 NOTE — Progress Notes (Signed)
Her sedimentation rate is still elevated then rheumatoid factor is borderline positive. There is a schedule ultrasound of bilateral hands to evaluate. Her vitamin D is low normal. Advise patient to take 1000 units of vitamin D daily.

## 2017-06-26 ENCOUNTER — Telehealth: Payer: Self-pay | Admitting: Rheumatology

## 2017-06-26 NOTE — Telephone Encounter (Signed)
Patient called stating that Dr. Corliss Skainseveshwar referred her to [facility].  Patient states that because they are a hospital based PT clinic they collect a separate fee in addition to her copay.  Patient is requesting that she be referred to a P.T. Clinic not connected to the hospital.

## 2017-06-27 NOTE — Progress Notes (Signed)
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   History of Present Illness: Alexis Carr is a 52 y.o. female with history of polyarthralgia and osteoarthritis.  She states she continues to have some pain and stiffness in her joints.  She is not having any joint swelling.  She has been having off-and-on discomfort in the trochanteric bursa.  She has a stiffness in her knee joints especially in the morning.  She has discomfort in her feet.  She has discomfort from plantar fasciitis.  The lower back continues to hurt.  Activities of Daily Living:  Patient reports morning stiffness for 45  minutes.   Patient Reports nocturnal pain.  Difficulty dressing/grooming: Denies Difficulty climbing stairs: Denies Difficulty getting out of chair: Denies Difficulty using hands for taps, buttons, cutlery, and/or writing: Denies   Review of Systems  Constitutional: Negative.  Negative for fatigue, night sweats, weight gain, weight loss and weakness.  HENT: Negative for mouth sores, trouble swallowing, trouble swallowing, mouth dryness and nose dryness.   Eyes: Negative for pain, redness, visual disturbance and dryness.  Respiratory: Negative.  Negative for cough, shortness of breath and difficulty breathing.   Cardiovascular: Negative.  Negative for chest pain, palpitations, hypertension, irregular heartbeat and swelling in legs/feet.  Gastrointestinal: Negative.  Negative for blood in stool, constipation and diarrhea.  Endocrine: Negative.  Negative for increased urination.  Genitourinary: Negative.  Negative for nocturia and vaginal dryness.  Musculoskeletal: Positive for arthralgias, joint pain and morning stiffness. Negative for joint swelling, myalgias, muscle weakness, muscle tenderness and  myalgias.  Skin: Negative.  Negative for color change, rash, hair loss, skin tightness, ulcers and sensitivity to sunlight.  Allergic/Immunologic: Negative for susceptible to infections.  Neurological: Negative.  Negative for dizziness, headaches, memory loss and night sweats.  Hematological: Negative.  Negative for swollen glands.  Psychiatric/Behavioral: Negative.  Negative for depressed mood and sleep disturbance. The patient is not nervous/anxious.     PMFS History:  Patient Active Problem List   Diagnosis Date Noted  . 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 Vitamin D deficiency 05/31/2017  . Iron deficiency anemia 05/31/2017  . Bilateral plantar fasciitis 05/31/2017  . DDD (degenerative disc disease), lumbar 05/31/2017  . General medical examination 09/05/2010  . Attention deficit disorder 07/21/2008    Past Medical History:  Diagnosis Date  . ADD (attention deficit disorder)   . Anemia   . 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 Coronary artery disease Unknown        GM  . Hypertension Unknown        GM,GF  . Stroke Unknown        GM  . Throat cancer Unknown        GF  . Diabetes Neg Hx   . Colon cancer Neg Hx   . Breast cancer Neg Hx    Past Surgical History:  Procedure Laterality Date  . CESAREAN SECTION     x2  .  CYSTECTOMY     wrist   . FOOT SURGERY Right    screw in great toe   . TUMOR REMOVAL     Uterine Fibroid   Social History   Social History Narrative         Objective: Vital Signs: BP 134/79   Pulse 88   Resp 12   Ht '5\' 6"'  (1.676 m)   Wt 224 lb (101.6 kg)   BMI 36.15 kg/m    Physical Exam  Constitutional: She is oriented to person, place, and time. She appears well-developed and well-nourished.  HENT:  Head: Normocephalic and atraumatic.  Eyes: Conjunctivae and EOM are normal.  Neck: Normal range  of motion.  Cardiovascular: Normal rate, regular rhythm, normal heart sounds and intact distal pulses.  Pulmonary/Chest: Effort normal and breath sounds normal.  Abdominal: Soft. Bowel sounds are normal.  Lymphadenopathy:    She has no cervical adenopathy.  Neurological: She is alert and oriented to person, place, and time.  Skin: Skin is warm and dry. Capillary refill takes less than 2 seconds.  Psychiatric: She has a normal mood and affect. Her behavior is normal.  Nursing note and vitals reviewed.    Musculoskeletal Exam: C-spine good range of motion.  She has discomfort range of motion of her lumbar spine.  Shoulder joints elbows joints wrist joint MCPs PIPs DIPs with good range of motion with no synovitis.  Hip joints knee joints ankles MTPs PIPs DIPs with good range of motion with no synovitis.  She has tenderness over bilateral trochanteric bursa.  CDAI Exam: No CDAI exam completed.    Investigation: No additional findings.  Component     Latest Ref Rng & Units 05/31/2017  Total Protein     6.1 - 8.1 g/dL 7.4  Albumin ELP     3.8 - 4.8 g/dL 4.0  Alpha 1     0.2 - 0.3 g/dL 0.3  Alpha 2     0.5 - 0.9 g/dL 0.8  Beta Globulin     0.4 - 0.6 g/dL 0.4  Beta 2     0.2 - 0.5 g/dL 0.4  Gamma Globulin     0.8 - 1.7 g/dL 1.4  SPE Interp.        Immunoglobulin A     81 - 463 mg/dL 221  IgG (Immunoglobin G), Serum     694 - 1,618 mg/dL 1,401  IgM, Serum     48 - 271 mg/dL 111  Hepatitis C Ab     NON-REACTI NON-REACTIVE  SIGNAL TO CUT-OFF     <1.00 0.03  CK Total     29 - 143 U/L 52  Sed Rate     0 - 30 mm/h 41 (H)  Vitamin D, 25-Hydroxy     30 - 100 ng/mL 30  RA Latex Turbid.     <14 IU/mL 15 (H)  Cyclic Citrullin Peptide Ab     UNITS 16  14-3-3 eta Protein     <0.2 ng/mL <0.2  HLA-B27 Antigen     Negative Negative  Hep B Core Ab, IgM     NON-REACTI NON-REACTIVE  Hepatitis B Surface Ag     NON-REACTI NON-REACTIVE  G-6PDH     7.0 - 20.5 U/g Hgb 10.8   CBC  Latest Ref Rng & Units 05/31/2017 08/26/2010 04/02/2009  WBC 3.8 - 10.8 Thousand/uL 5.4 5.9 6.2  Hemoglobin 11.7 - 15.5 g/dL 11.6(L) 11.9(L) 11.5(L)  Hematocrit 35.0 - 45.0 % 34.6(L) 35.0(L) 35.6(L)  Platelets 140 - 400 Thousand/uL 170 166.0 171   CMP Latest Ref Rng & Units 05/31/2017 05/31/2017 08/26/2010  Glucose 65 - 99 mg/dL - 81 73  BUN 7 - 25 mg/dL - 11 10  Creatinine 0.50 - 1.05 mg/dL - 0.64 0.5  Sodium 135 - 146 mmol/L - 137 138  Potassium 3.5 - 5.3 mmol/L - 3.9 4.2  Chloride 98 - 110 mmol/L - 103 103  CO2 20 - 32 mmol/L - 28 30  Calcium 8.6 - 10.4 mg/dL - 9.1 9.3  Total Protein 6.1 - 8.1 g/dL 7.4 7.1 7.4  Total Bilirubin 0.2 - 1.2 mg/dL - 0.8 0.6  Alkaline Phos 39 - 117 U/L - - 74  AST 10 - 35 U/L - 19 30  ALT 6 - 29 U/L - 14 24    Imaging: No results found.  Speciality Comments: No specialty comments available.    Procedures:  No procedures performed Allergies: Patient has no known allergies.   Assessment / Plan:     Visit Diagnoses: Polyarthralgia - RF borderline +, ESR elevatedno synovitis on exam.Will schedule ultrasound of bilateral hands.    Trochanteric bursitis of both hips: We will refer her for physical therapy.  Primary osteoarthritis of both knees: Chronic pain.  Weight loss diet and exercise was discussed.  Primary osteoarthritis of both feet: Proper fitting shoes were discussed.  Plantar fasciitis, bilateral -she was referred to physical therapy  DDD (degenerative disc disease), lumbar - MRI 10/2016: L3-L4 and L4-L5 bulging discs, worse at L3-L4 with rightward deviation and minimal formainal narrowing.  She was referred to physical therapy.  Other fatigue  Vitamin D deficiency: She is on supplement.  Nontoxic goiter    Orders: No orders of the defined types were placed in this encounter.  No orders of the defined types were placed in this encounter.   Face-to-face time spent with patient was 30 minutes.  Greater than 50% of time was spent in  counseling and coordination of care.  Follow-Up Instructions: Return in about 3 months (around 10/02/2017) for Polyarthralgia, osteoarthritis, DJD.   Bo Merino, MD  Note - This record has been created using Editor, commissioning.  Chart creation errors have been sought, but may not always  have been located. Such creation errors do not reflect on  the standard of medical care.

## 2017-07-05 ENCOUNTER — Encounter: Payer: Self-pay | Admitting: Rheumatology

## 2017-07-05 ENCOUNTER — Encounter (INDEPENDENT_AMBULATORY_CARE_PROVIDER_SITE_OTHER): Payer: Self-pay

## 2017-07-05 ENCOUNTER — Ambulatory Visit: Payer: BC Managed Care – PPO | Admitting: Rheumatology

## 2017-07-05 VITALS — BP 134/79 | HR 88 | Resp 12 | Ht 66.0 in | Wt 224.0 lb

## 2017-07-05 DIAGNOSIS — M19072 Primary osteoarthritis, left ankle and foot: Secondary | ICD-10-CM | POA: Diagnosis not present

## 2017-07-05 DIAGNOSIS — R5383 Other fatigue: Secondary | ICD-10-CM

## 2017-07-05 DIAGNOSIS — M2550 Pain in unspecified joint: Secondary | ICD-10-CM

## 2017-07-05 DIAGNOSIS — M19071 Primary osteoarthritis, right ankle and foot: Secondary | ICD-10-CM

## 2017-07-05 DIAGNOSIS — M7061 Trochanteric bursitis, right hip: Secondary | ICD-10-CM

## 2017-07-05 DIAGNOSIS — E559 Vitamin D deficiency, unspecified: Secondary | ICD-10-CM

## 2017-07-05 DIAGNOSIS — M5136 Other intervertebral disc degeneration, lumbar region: Secondary | ICD-10-CM | POA: Diagnosis not present

## 2017-07-05 DIAGNOSIS — E049 Nontoxic goiter, unspecified: Secondary | ICD-10-CM | POA: Diagnosis not present

## 2017-07-05 DIAGNOSIS — M7062 Trochanteric bursitis, left hip: Secondary | ICD-10-CM | POA: Diagnosis not present

## 2017-07-05 DIAGNOSIS — M722 Plantar fascial fibromatosis: Secondary | ICD-10-CM

## 2017-07-05 DIAGNOSIS — M51369 Other intervertebral disc degeneration, lumbar region without mention of lumbar back pain or lower extremity pain: Secondary | ICD-10-CM

## 2017-07-05 DIAGNOSIS — M170 Bilateral primary osteoarthritis of knee: Secondary | ICD-10-CM

## 2017-07-09 ENCOUNTER — Ambulatory Visit: Payer: BC Managed Care – PPO | Attending: Rheumatology | Admitting: Physical Therapy

## 2017-07-12 NOTE — Telephone Encounter (Signed)
Referral faxed to [facility] in [place], pending appt

## 2017-07-18 ENCOUNTER — Ambulatory Visit (INDEPENDENT_AMBULATORY_CARE_PROVIDER_SITE_OTHER): Payer: Self-pay

## 2017-07-18 ENCOUNTER — Encounter (INDEPENDENT_AMBULATORY_CARE_PROVIDER_SITE_OTHER): Payer: Self-pay

## 2017-07-18 ENCOUNTER — Ambulatory Visit: Payer: BC Managed Care – PPO | Admitting: Rheumatology

## 2017-07-18 DIAGNOSIS — M79642 Pain in left hand: Secondary | ICD-10-CM

## 2017-07-18 DIAGNOSIS — M79641 Pain in right hand: Secondary | ICD-10-CM | POA: Diagnosis not present

## 2017-07-18 NOTE — Progress Notes (Signed)
Ultrasound examination of bilateral hands was performed per EULAR recommendations. Using 12 MHz transducer, grayscale and power Doppler bilateral second, third, and fifth MCP joints and bilateral wrist joints both dorsal and volar aspects were evaluated to look for synovitis or tenosynovitis. The findings were there was no synovitis or tenosynovitis on ultrasound examination. Right median nerve was 0.11 cm squares which was within normal limits and left median nerve was 0.14 cm squares which was more than upper limits of normal.  Impression: Ultrasound examination of bilateral hands did not show any synovitis.  Left median nerve was enlarged but patient had no symptoms of carpal tunnel syndrome. Pollyann SavoyShaili Aissa Lisowski, MD

## 2017-07-24 ENCOUNTER — Telehealth: Payer: Self-pay | Admitting: Rheumatology

## 2017-07-24 NOTE — Telephone Encounter (Signed)
Megan from [facility] called to let us know that they scheduled her appointment and the patient no showed and when they called her back to r/s her appointment, the patient picked up the phone and hung up on them.  CB#[phone].  Thank you.

## 2017-10-10 ENCOUNTER — Ambulatory Visit: Payer: BC Managed Care – PPO | Admitting: Rheumatology

## 2017-11-10 ENCOUNTER — Other Ambulatory Visit: Payer: Self-pay | Admitting: Otolaryngology

## 2017-11-10 DIAGNOSIS — M47812 Spondylosis without myelopathy or radiculopathy, cervical region: Secondary | ICD-10-CM

## 2017-11-10 DIAGNOSIS — R1310 Dysphagia, unspecified: Secondary | ICD-10-CM

## 2017-11-16 ENCOUNTER — Ambulatory Visit
Admission: RE | Admit: 2017-11-16 | Discharge: 2017-11-16 | Disposition: A | Discharge status: Home / Self Care | Payer: BC Managed Care – PPO | Source: Ambulatory Visit | Attending: Otolaryngology | Admitting: Otolaryngology

## 2017-11-16 DIAGNOSIS — R1310 Dysphagia, unspecified: Secondary | ICD-10-CM

## 2017-11-16 DIAGNOSIS — M47812 Spondylosis without myelopathy or radiculopathy, cervical region: Secondary | ICD-10-CM

## 2017-11-27 ENCOUNTER — Other Ambulatory Visit: Payer: Self-pay | Admitting: Otolaryngology

## 2017-11-27 DIAGNOSIS — R1310 Dysphagia, unspecified: Secondary | ICD-10-CM

## 2017-11-27 DIAGNOSIS — M47812 Spondylosis without myelopathy or radiculopathy, cervical region: Secondary | ICD-10-CM

## 2018-05-13 ENCOUNTER — Ambulatory Visit: Payer: BC Managed Care – PPO | Admitting: Women's Health

## 2018-05-13 DIAGNOSIS — Z0289 Encounter for other administrative examinations: Secondary | ICD-10-CM

## 2018-11-28 ENCOUNTER — Other Ambulatory Visit: Payer: Self-pay

## 2018-11-28 ENCOUNTER — Ambulatory Visit: Payer: BC Managed Care – PPO | Admitting: Allergy and Immunology

## 2018-11-28 ENCOUNTER — Encounter: Payer: Self-pay | Admitting: Allergy and Immunology

## 2018-11-28 VITALS — BP 146/80 | HR 92 | Temp 98.0°F | Resp 16 | Ht 65.5 in | Wt 171.4 lb

## 2018-11-28 DIAGNOSIS — H1010 Acute atopic conjunctivitis, unspecified eye: Secondary | ICD-10-CM

## 2018-11-28 DIAGNOSIS — W57XXXD Bitten or stung by nonvenomous insect and other nonvenomous arthropods, subsequent encounter: Secondary | ICD-10-CM

## 2018-11-28 DIAGNOSIS — G43909 Migraine, unspecified, not intractable, without status migrainosus: Secondary | ICD-10-CM

## 2018-11-28 DIAGNOSIS — J3089 Other allergic rhinitis: Secondary | ICD-10-CM | POA: Diagnosis not present

## 2018-11-28 DIAGNOSIS — J301 Allergic rhinitis due to pollen: Secondary | ICD-10-CM

## 2018-11-28 DIAGNOSIS — S30861D Insect bite (nonvenomous) of abdominal wall, subsequent encounter: Secondary | ICD-10-CM

## 2018-11-28 DIAGNOSIS — G4700 Insomnia, unspecified: Secondary | ICD-10-CM

## 2018-11-28 MED ORDER — CYPROHEPTADINE HCL 4 MG PO TABS: ORAL_TABLET | ORAL | 5 refills | Status: DC

## 2018-11-28 MED ORDER — MONTELUKAST SODIUM 10 MG PO TABS: 10.0000 mg | ORAL_TABLET | Freq: Every day | ORAL | 5 refills | Status: DC

## 2018-11-28 MED ORDER — HALOBETASOL PROPIONATE 0.05 % EX CREA: TOPICAL_CREAM | CUTANEOUS | 1 refills | Status: DC

## 2018-11-28 MED ORDER — OLOPATADINE HCL 0.2 % OP SOLN: OPHTHALMIC | 5 refills | Status: DC

## 2018-11-28 NOTE — Patient Instructions (Addendum)
  1.  Allergen avoidance measures. DEET    2.  Treat and prevent inflammation:   A.  OTC Nasacort -1-2 sprays each nostril daily  B.  Montelukast 10 mg -1 tablet daily  3.  Treat and prevent headaches:   A.  Periactin 4 mg tablet-1/2 tablet at bedtime  4.  If needed:   A.  OTC antihistamine -loratadine/cetirizine 10 mg daily  B.  OTC Pataday-1 drop each eye daily  C.  Ultravate cream to insect bite 2 times per day  5.  Return to clinic in 4 weeks or earlier if problem  6.  Immunotherapy?

## 2018-11-28 NOTE — Progress Notes (Signed)
[place] [facility] [place] - [place] - [place] - [place]   Dear Dr. Laqueta Due,  Thank you for referring RADHIKA DERSHEM to the [facility] on 11/28/2018.   Below is a summation of this patient's evaluation and recommendations.  Thank you for your referral. I will keep you informed about this patient's response to treatment.   If you have any questions please do not hesitate to contact me.   Sincerely,  Jiles Prows, MD Allergy / Immunology [place]   ______________________________________________________________________    NEW PATIENT NOTE  Referring Provider: Ernestene Kiel, MD Primary Provider: Ernestene Kiel, MD Date of office visit: 11/28/2018    Subjective:   Chief Complaint:  Carmelia Bake (DOB: [birth year]) is a 53 y.o. female who presents to the clinic on 11/28/2018 with a chief complaint of Allergic Rhinitis  .     HPI: Breda presents to this clinic in evaluation of 3 main issues.   First, she has a long history of runny nose and sneezing and nasal congestion and itchy red watery eyes occurring on a perennial basis with seasonal exacerbation especially following exposure to the outdoors and pollens and sometimes with exposure to moldy environments inside her workplace that has been a progressive issue over the course of the past several years.  She has no associated anosmia or ugly nasal discharge or requires therapy for sinusitis.  She has continued to remain symptomatic in the face of utilizing various antihistamines both orally and nasally.  She has had recent blood test performed which identified IgE antibodies against multiple aeroallergens prevalent in [place].  Second, she has headaches.  She has headaches about twice a week during spring and fall.  These headaches are located in her frontal region and supraorbital region and they feel as though they  are pushing.  She does have nausea associated with these headaches.  Usually she lays down for relief for about 1 or 2 hours and then her headaches are better.  When she is not in season she gets a headache about once a week or once every other week.  She does not consume any caffeine and rarely consumes chocolate and does not consume alcohol.  She has about 2 hours of initiation insomnia every night.  Third, she gets large local reactions to insect stings without any associated systemic or constitutional symptoms.  Past Medical History:  Diagnosis Date  . Acne   . ADD (attention deficit disorder)   . Anemia   . 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 CESAREAN SECTION  2002, 2004   x2  . CYSTECTOMY     wrist   . FOOT SURGERY Right    screw in great toe   . TUMOR REMOVAL     Uterine Fibroid    Allergies as of 11/28/2018   No Known Allergies     Medication List    buPROPion 300 MG 24 hr tablet Commonly known as: WELLBUTRIN XL Take 300 mg by mouth daily.   Claravis 40 MG capsule Generic drug: ISOtretinoin Take 40 mg by mouth 2 (two) times daily.   IRON 27 PO Take by mouth daily.   levocetirizine 5 MG tablet Commonly known as: XYZAL 1 TABLET ONCE A  DAY AS NEEDED FOR ITCHING OR ALLERGIES ORALLY 30 DAY(S)   Olopatadine HCl 0.6 % Soln 2 SPRAYS IN EACH NOSTRIL TWICE A DAY AS NEEDED FOR NASAL CONGESTION NASALLY 30 DAY(S)   Vitamin D (Ergocalciferol) 1.25 MG (50000 UT) Caps capsule Commonly known as: DRISDOL Take 50,000 Units by mouth every 7 (seven) days.   Vyvanse 40 MG capsule Generic drug: lisdexamfetamine TAKE 1 CAPSULE BY MOUTH EVERY DAY IN THE MORNING What changed: Another medication with the same name was removed. Continue taking this medication, and follow the directions you see here. Changed by: ERIC Claudia PollockJ KOZLOW, MD        Review of systems negative except as noted in HPI / PMHx or noted below:  Review of Systems  Constitutional: Negative.   HENT: Negative.   Eyes: Negative.   Respiratory: Negative.   Cardiovascular: Negative.   Gastrointestinal: Negative.   Genitourinary: Negative.   Musculoskeletal: Negative.   Skin: Negative.   Neurological: Negative.   Endo/Heme/Allergies: Negative.   Psychiatric/Behavioral: Negative.     Family History  Problem Relation Age of Onset  . Coronary artery disease Other        GM  . Hypertension Other        GM,GF  . Stroke Other        GM  . Throat cancer Other        GF  . Diabetes Neg Hx   . Colon cancer Neg Hx   . Breast cancer Neg Hx     Social History   Socioeconomic History  . Marital status: Married    Spouse name: Not on file  . Number of children: 3  . Years of education: Not on file  . Highest education level: Not on file  Occupational History  . Occupation: Engineer, siteschool teacher  Social Needs  . Financial resource strain: Not on file  . Food insecurity    Worry: Not on file    Inability: Not on file  . Transportation needs    Medical: Not on file    Non-medical: Not on file  Tobacco Use  . Smoking status: Never Smoker  . Smokeless tobacco: Never Used  Substance and Sexual Activity  . Alcohol use: No  . Drug use: No  . Sexual activity: Not on file  Lifestyle  . Physical activity    Days per week: Not on file    Minutes per session: Not on file  . Stress: Not on file  Relationships  . Social Musicianconnections    Talks on phone: Not on file    Gets together: Not on file    Attends religious service: Not on file    Active member of club or organization: Not on file    Attends meetings of clubs or organizations: Not on file    Relationship status: Not on file  . Intimate partner violence    Fear of current or ex partner: Not on file    Emotionally abused: Not on file    Physically abused: Not on file    Forced sexual activity:  Not on file  Other Topics Concern  . Not on file  Social History Narrative        Environmental and Social history  Lives in a house with a dry environment, a dog located inside the household, carpet in the bedroom, plastic on the bed, plastic on the pillow, no smokers located to the household.  She is a Runner, broadcasting/film/videoteacher.  Objective:   Vitals:  11/28/18 1426  BP: (!) 146/80  Pulse: 92  Resp: 16  Temp: 98 F (36.7 C)  SpO2: 98%   Height: 5' 5.5" (166.4 cm) Weight: 171 lb 6.4 oz (77.7 kg)  Physical Exam Constitutional:      Appearance: She is not diaphoretic.  HENT:     Head: Normocephalic. No right periorbital erythema or left periorbital erythema.     Right Ear: Tympanic membrane, ear canal and external ear normal.     Left Ear: Tympanic membrane, ear canal and external ear normal.     Nose: Nose normal. No mucosal edema or rhinorrhea.     Mouth/Throat:     Pharynx: No oropharyngeal exudate.  Eyes:     General: Lids are normal.     Conjunctiva/sclera: Conjunctivae normal.     Pupils: Pupils are equal, round, and reactive to light.  Neck:     Thyroid: No thyromegaly.     Trachea: Trachea normal. No tracheal deviation.  Cardiovascular:     Rate and Rhythm: Normal rate and regular rhythm.     Heart sounds: Normal heart sounds, S1 normal and S2 normal. No murmur.  Pulmonary:     Effort: Pulmonary effort is normal. No respiratory distress.     Breath sounds: No stridor. No wheezing or rales.  Chest:     Chest wall: No tenderness.  Abdominal:     General: There is no distension.     Palpations: Abdomen is soft. There is no mass.     Tenderness: There is no abdominal tenderness. There is no guarding or rebound.  Musculoskeletal:        General: No tenderness.  Lymphadenopathy:     Head:     Right side of head: No tonsillar adenopathy.     Left side of head: No tonsillar adenopathy.     Cervical: No cervical adenopathy.  Skin:    Coloration: Skin is not pale.      Findings: No erythema or rash.     Nails: There is no clubbing.   Neurological:     Mental Status: She is alert.     Diagnostics: Allergy skin tests were performed.  She demonstrated hypersensitivity against grasses, trees, weeds.  Results of the rhinoscopy performed by Dr. Lazarus SalinesWolicki on 09 November 2017 identified the following:  Using the flexible laryngoscope, nasopharynx is clear with normal eustachian tori. Oropharynx is clear. Hypopharynx/larynx shows mobile vocal cords with good airway. There is some postcricoid swelling. No pooling in valleculae or piriforms.  Results of a barium swallow obtained 16 November 2017 identifies the following:  Initial barium swallows demonstrate normal pharyngeal motion with swallowing. No laryngeal penetration or aspiration. No upper esophageal webs, strictures or diverticuli. Large bridging osteophytes are noted at C5-6 with moderate impression on the posterior aspect of the esophagus.  Nonspecific esophageal motility disorder with occasional disruption of the primary peristaltic wave and occasional tertiary contractions. There is a small sliding-type hiatal hernia with minimal inducible GE reflux. No distal mass or stricture.  Results of blood tests obtained 31 May 2017 identifies WBC 5.4, absolute eosinophil 49, absolute lymphocyte 1577, hemoglobin 11.6, platelet 170, sed rate 41, IgG 1401 mg/DL  Assessment and Plan:    1. Perennial allergic rhinitis   2. Seasonal allergic rhinitis due to pollen   3. Seasonal allergic conjunctivitis   4. Migraine syndrome   5. Sleep initiation dysfunction   6. Insect bite of abdomen with local reaction, subsequent encounter     1.  Allergen  avoidance measures. DEET    2.  Treat and prevent inflammation:   A.  OTC Nasacort -1-2 sprays each nostril daily  B.  Montelukast 10 mg -1 tablet daily  3.  Treat and prevent headaches:   A.  Periactin 4 mg tablet-1/2 tablet at bedtime  4.  If needed:   A.   OTC antihistamine -loratadine/cetirizine 10 mg daily  B.  OTC Pataday-1 drop each eye daily  C.  Ultravate cream to insect bite 2 times per day  5.  Return to clinic in 4 weeks or earlier if problem  6.  Immunotherapy?  Gwenyth Benderanzy has atopic immune system and we will get her to perform allergen avoidance measures as best as possible and use anti-inflammatory agents for her airway.  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  She has a history of large local reactions to insect stings and I have asked her to use DEET preventatively and she can use a high potency topical steroid to her insect bites to hopefully minimize large local reaction.  I will regroup with her in 4 weeks to assess her response to this approach and consider further evaluation and treatment based upon her response.  Jessica PriestEric J. Kozlow, MD Allergy / Immunology [facility]

## 2018-12-02 ENCOUNTER — Encounter: Payer: Self-pay | Admitting: Allergy and Immunology

## 2018-12-26 ENCOUNTER — Ambulatory Visit: Payer: BC Managed Care – PPO | Admitting: Allergy and Immunology

## 2018-12-30 ENCOUNTER — Other Ambulatory Visit: Payer: Self-pay

## 2018-12-30 ENCOUNTER — Encounter: Payer: Self-pay | Admitting: Allergy and Immunology

## 2018-12-30 ENCOUNTER — Ambulatory Visit (INDEPENDENT_AMBULATORY_CARE_PROVIDER_SITE_OTHER): Payer: BC Managed Care – PPO | Admitting: Allergy and Immunology

## 2018-12-30 VITALS — BP 132/90 | HR 91 | Temp 97.9°F | Resp 16

## 2018-12-30 DIAGNOSIS — J3089 Other allergic rhinitis: Secondary | ICD-10-CM | POA: Diagnosis not present

## 2018-12-30 DIAGNOSIS — H1010 Acute atopic conjunctivitis, unspecified eye: Secondary | ICD-10-CM | POA: Diagnosis not present

## 2018-12-30 DIAGNOSIS — G43909 Migraine, unspecified, not intractable, without status migrainosus: Secondary | ICD-10-CM | POA: Diagnosis not present

## 2018-12-30 DIAGNOSIS — J301 Allergic rhinitis due to pollen: Secondary | ICD-10-CM | POA: Diagnosis not present

## 2018-12-30 DIAGNOSIS — G4700 Insomnia, unspecified: Secondary | ICD-10-CM

## 2018-12-30 DIAGNOSIS — S30861D Insect bite (nonvenomous) of abdominal wall, subsequent encounter: Secondary | ICD-10-CM

## 2018-12-30 DIAGNOSIS — W57XXXD Bitten or stung by nonvenomous insect and other nonvenomous arthropods, subsequent encounter: Secondary | ICD-10-CM

## 2018-12-30 NOTE — Progress Notes (Signed)
[facility]   Follow-up Note  Referring Provider: Ernestene Kiel, MD Primary Provider: Ernestene Kiel, MD Date of Office Visit: 12/30/2018  Subjective:   Alexis Carr (DOB: [birth year]) is a 53 y.o. female who returns to the [facility] on 12/30/2018 in re-evaluation of the following:  HPI: Kemonie presents to this clinic in evaluation of allergic rhinoconjunctivitis, chronic headaches, and a history of large local reactions to insect stings without associated systemic or constitutional symptoms.  She was last seen in this clinic during her initial evaluation 28 November 2018 at which point in time we addressed each issue.  Her allergic rhinoconjunctivitis is under excellent control at this point in time on her current medications.  Of course, we have not really had much pollen exposure during the summer.  She continues to consistently use montelukast and a nasal steroid.  She continues to have headaches and some initiation insomnia.  She has had more headaches since she has returned to school in a very moldy classroom.  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  She shows me pictures of mold growth on the walls of her classroom.  Allergies as of 12/30/2018   No Known Allergies     Medication List      buPROPion 300 MG 24 hr tablet Commonly known as: WELLBUTRIN XL Take 300 mg by mouth daily.   Claravis 40 MG capsule Generic drug: ISOtretinoin Take 40 mg by mouth 2 (two) times daily.   cyproheptadine 4 MG tablet Commonly known as: PERIACTIN Take one-half tablet by mouth at bedtime.   halobetasol 0.05 % cream Commonly known as: ULTRAVATE Can apply to insect bites twice daily if needed.   IRON 27 PO Take by mouth daily.   levocetirizine 5 MG tablet Commonly known as: XYZAL 1 TABLET ONCE A DAY AS NEEDED FOR ITCHING OR ALLERGIES ORALLY 30  DAY(S)   montelukast 10 MG tablet Commonly known as: SINGULAIR Take 1 tablet (10 mg total) by mouth at bedtime.   Olopatadine HCl 0.2 % Soln Can use one drop in each eye once daily if needed.   Olopatadine HCl 0.6 % Soln 2 SPRAYS IN EACH NOSTRIL TWICE A DAY AS NEEDED FOR NASAL CONGESTION NASALLY 30 DAY(S)   Vitamin D (Ergocalciferol) 1.25 MG (50000 UT) Caps capsule Commonly known as: DRISDOL Take 50,000 Units by mouth every 7 (seven) days.   Vyvanse 40 MG capsule Generic drug: lisdexamfetamine TAKE 1 CAPSULE BY MOUTH EVERY DAY IN THE MORNING       Past Medical History:  Diagnosis Date  . Acne   . ADD (attention deficit disorder)   . Anemia   . 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 CESAREAN SECTION  2002, 2004   x2  . CYSTECTOMY     wrist   . FOOT SURGERY Right    screw in great toe   . TUMOR REMOVAL     Uterine Fibroid    Review of systems negative except as noted in HPI / PMHx or noted below:  Review of Systems  Constitutional: Negative.   HENT: Negative.   Eyes: Negative.   Respiratory: Negative.   Cardiovascular: Negative.   Gastrointestinal: Negative.  Genitourinary: Negative.   Musculoskeletal: Negative.   Skin: Negative.   Neurological: Negative.   Endo/Heme/Allergies: Negative.   Psychiatric/Behavioral: Negative.      Objective:   Vitals:   12/30/18 1705  BP: 132/90  Pulse: 91  Resp: 16  Temp: 97.9 F (36.6 C)  SpO2: 98%          Physical Exam Constitutional:      Appearance: She is not diaphoretic.  HENT:     Head: Normocephalic.     Right Ear: Tympanic membrane, ear canal and external ear normal.     Left Ear: Tympanic membrane, ear canal and external ear normal.     Nose: Nose normal. No mucosal edema or rhinorrhea.     Mouth/Throat:     Pharynx: Uvula midline. No  oropharyngeal exudate.  Eyes:     Conjunctiva/sclera: Conjunctivae normal.  Neck:     Thyroid: No thyromegaly.     Trachea: Trachea normal. No tracheal tenderness or tracheal deviation.  Cardiovascular:     Rate and Rhythm: Normal rate and regular rhythm.     Heart sounds: Normal heart sounds, S1 normal and S2 normal. No murmur.  Pulmonary:     Effort: No respiratory distress.     Breath sounds: Normal breath sounds. No stridor. No wheezing or rales.  Lymphadenopathy:     Head:     Right side of head: No tonsillar adenopathy.     Left side of head: No tonsillar adenopathy.     Cervical: No cervical adenopathy.  Skin:    Findings: No erythema or rash.     Nails: There is no clubbing.   Neurological:     Mental Status: She is alert.     Diagnostics: none  Assessment and Plan:   1. Perennial allergic rhinitis   2. Seasonal allergic rhinitis due to pollen   3. Seasonal allergic conjunctivitis   4. Migraine syndrome   5. Sleep initiation dysfunction   6. Insect bite of abdomen with local reaction, subsequent encounter     1.  Continue to perform Allergen avoidance measures. Dehumidify classroom.  2.  Continue to Treat and prevent inflammation:   A.  OTC Nasacort -1-2 sprays each nostril daily  B.  Montelukast 10 mg -1 tablet daily  3.  Continue to Treat and prevent headaches:   A.  Periactin 4 mg tablet- 1-1-1/2-2 tablet at bedtime (4,6,8 mg)  4.  If needed:   A.  OTC antihistamine -loratadine/cetirizine 10 mg daily  B.  OTC Pataday-1 drop each eye daily  C.  Ultravate cream to insect bite 2 times per day  5.  Return to clinic in 8 weeks or earlier if problem  6.  Obtain fall flu vaccine (and COVID vaccine)  I informed Jodene Namanzi that she should get a dehumidifier her her classroom.  It is just not good to be exposed to a moldy environment for several hours per day and that room needs to really be dried out to eliminate mold growth.  Apparently the school is aware of  this issue.  We will have her increase her Periactin up to a maximum of 8 mg at bedtime to see if we can help with her initiation insomnia and also with her chronic headaches.  I will see her back in this clinic in 8 weeks to assess her response to this approach or she will contact me during the interval should to be a problem.  Laurette SchimkeEric Kozlow, MD Allergy / Immunology   [facility]

## 2018-12-30 NOTE — Patient Instructions (Addendum)
  1.  Continue to perform Allergen avoidance measures. Dehumidify classroom.  2.  Continue to Treat and prevent inflammation:   A.  OTC Nasacort -1-2 sprays each nostril daily  B.  Montelukast 10 mg -1 tablet daily  3.  Continue to Treat and prevent headaches:   A.  Periactin 4 mg tablet- 1-1-1/2-2 tablet at bedtime (4,6,8 mg)  4.  If needed:   A.  OTC antihistamine -loratadine/cetirizine 10 mg daily  B.  OTC Pataday-1 drop each eye daily  C.  Ultravate cream to insect bite 2 times per day  5.  Return to clinic in 8 weeks or earlier if problem  6.  Obtain fall flu vaccine (and COVID vaccine)

## 2018-12-31 ENCOUNTER — Encounter: Payer: Self-pay | Admitting: Allergy and Immunology

## 2019-02-24 ENCOUNTER — Ambulatory Visit: Payer: BC Managed Care – PPO | Admitting: Allergy and Immunology

## 2021-02-04 ENCOUNTER — Other Ambulatory Visit (HOSPITAL_COMMUNITY): Payer: Self-pay | Admitting: General Surgery

## 2021-02-04 ENCOUNTER — Other Ambulatory Visit (HOSPITAL_BASED_OUTPATIENT_CLINIC_OR_DEPARTMENT_OTHER): Payer: Self-pay | Admitting: General Surgery

## 2021-02-08 ENCOUNTER — Other Ambulatory Visit: Payer: Self-pay

## 2021-02-08 ENCOUNTER — Ambulatory Visit (HOSPITAL_COMMUNITY)
Admission: RE | Admit: 2021-02-08 | Discharge: 2021-02-08 | Disposition: A | Discharge status: Home / Self Care | Payer: BC Managed Care – PPO | Source: Ambulatory Visit | Attending: General Surgery | Admitting: General Surgery

## 2021-02-25 ENCOUNTER — Other Ambulatory Visit: Payer: Self-pay | Admitting: General Surgery

## 2021-02-25 DIAGNOSIS — R5381 Other malaise: Secondary | ICD-10-CM

## 2021-03-10 ENCOUNTER — Other Ambulatory Visit: Payer: Self-pay | Admitting: General Surgery

## 2021-03-10 DIAGNOSIS — Z1231 Encounter for screening mammogram for malignant neoplasm of breast: Secondary | ICD-10-CM

## 2021-03-16 ENCOUNTER — Other Ambulatory Visit: Payer: Self-pay

## 2021-03-16 ENCOUNTER — Encounter: Payer: Self-pay | Admitting: Skilled Nursing Facility1

## 2021-03-16 ENCOUNTER — Encounter: Payer: BC Managed Care – PPO | Attending: General Surgery | Admitting: Skilled Nursing Facility1

## 2021-03-16 DIAGNOSIS — E669 Obesity, unspecified: Secondary | ICD-10-CM | POA: Insufficient documentation

## 2021-03-16 NOTE — Progress Notes (Signed)
Nutrition Assessment for Bariatric Surgery Medical Nutrition Therapy Appt Start Time: 8:32    End Time: 9:30  Patient was seen on 03/16/2021 for Pre-Operative Nutrition Assessment. Letter of approval faxed to [facility] bariatric surgery program coordinator on 03/16/2021.   Referral stated Supervised Weight Loss (SWL) visits needed: 0  Pt completed visits.   Pt has cleared nutrition requirements.   Planned surgery: RYGB Pt expectation of surgery: to lose weight Pt expectation of dietitian: to help educate     NUTRITION ASSESSMENT   Anthropometrics  Start weight at [facility]: 233.3 lbs (date: 03/16/2021)  Height: 65.5 in BMI: 38.28 kg/m2     Clinical  Medical hx: anemia Medications: Vitamin C, beet root, flaxseed oil, CoQ10, turmeric, B12, vitamin D2, vitamin D3 with K, Omega 3, zinc, probiotic, magnesium, L-theanine, Evning primrose, ashwagandha, iron (ferrous sulfate), bupropion, Vyvanse   Labs: A1C 5.5 Notable signs/symptoms: knee pain Any previous deficiencies? No  Micronutrient Nutrition Focused Physical Exam: Hair: No issues observed Eyes: No issues observed Mouth: No issues observed Neck: No issues observed Nails: No issues observed Skin: No issues observed  Lifestyle & Dietary Hx  Pt states she feel she sometimes conducts mindless eating and grazing.   Pt state she is working on putting her self first and focusing on her health.   24-Hr Dietary Recall First Meal: decaf coffee + bacon Snack:  Second Meal: egg casserole + hashbrown Snack: fruit Third Meal: chicken tenders  Snack:  Beverages: decaf coffee, water + flavoring, carbonated water   Estimated Energy Needs Calories: 1500   NUTRITION DIAGNOSIS  Overweight/obesity ([place]-3.3) related to past poor dietary habits and physical inactivity as evidenced by patient w/ planned RYGB surgery following dietary guidelines for continued weight loss.    NUTRITION INTERVENTION  Nutrition counseling  (C-1) and education (E-2) to facilitate bariatric surgery goals.  Educated pt on micronutrient deficiencies post surgery and strategies to mitigate that risk   Pre-Op Goals Reviewed with the Patient Track food and beverage intake (pen and paper, MyFitness Pal, Baritastic app, etc.) Make healthy food choices while monitoring portion sizes Consume 3 meals per day or try to eat every 3-5 hours Avoid concentrated sugars and fried foods Keep sugar & fat in the single digits per serving on food labels Practice CHEWING your food (aim for applesauce consistency) Practice not drinking 15 minutes before, during, and 30 minutes after each meal and snack Avoid all carbonated beverages (ex: soda, sparkling beverages)  Limit caffeinated beverages (ex: coffee, tea, energy drinks) Avoid all sugar-sweetened beverages (ex: regular soda, sports drinks)  Avoid alcohol  Aim for 64-100 ounces of FLUID daily (with at least half of fluid intake being plain water)  Aim for at least 60-80 grams of PROTEIN daily Look for a liquid protein source that contains ?15 g protein and ?5 g carbohydrate (ex: shakes, drinks, shots) Make a list of non-food related activities Physical activity is an important part of a healthy lifestyle so keep it moving! The goal is to reach 150 minutes of exercise per week, including cardiovascular and weight baring activity: try yoga or arm chair exercises   *Goals that are bolded indicate the pt would like to start working towards these  Handouts Provided Include  Bariatric Surgery handouts (Nutrition Visits, Pre-Op Goals, Protein Shakes, Vitamins & Minerals)  Learning Style & Readiness for Change Teaching method utilized: Visual & Auditory  Demonstrated degree of understanding via: Teach Back  Readiness Level: action Barriers to learning/adherence to lifestyle change: none identified  MONITORING & EVALUATION Dietary intake, weekly physical activity, body weight, and pre-op goals  reached at next nutrition visit.    Next Steps  Patient is to follow up at [facility] for Pre-Op Class >2 weeks before surgery for further nutrition education.  Pt has completed visits. No further supervised visits required/recomended

## 2021-03-21 ENCOUNTER — Encounter: Payer: BC Managed Care – PPO | Admitting: Skilled Nursing Facility1

## 2021-03-21 ENCOUNTER — Other Ambulatory Visit: Payer: Self-pay

## 2021-03-21 DIAGNOSIS — E669 Obesity, unspecified: Secondary | ICD-10-CM

## 2021-03-21 NOTE — Progress Notes (Signed)
Pre-Operative Nutrition Class:    Patient was seen on 03/21/2021 for Pre-Operative Bariatric Surgery Education [facility]    Surgery date:  Surgery type: RYGB Start weight at [facility]: 233.3 Weight today: 236.3 pounds  Samples given per MNT protocol. Patient educated on appropriate usage: Ensure max exp: July 13, 2021 Ensure max lot: [id number]  Chewable bariatric advantage: advanced multi EA exp: 08/23 Chewable bariatric advantage: advanced multi EA lot: [id number]  Bariatric advantage calcium citrate exp: 02/23 Bariatric advantage calcium citrate lot: [id number]   The following the learning objectives were met by the patient during this course: Identify Pre-Op Dietary Goals and will begin 2 weeks pre-operatively Identify appropriate sources of fluids and proteins  State protein recommendations and appropriate sources pre and post-operatively Identify Post-Operative Dietary Goals and will follow for 2 weeks post-operatively Identify appropriate multivitamin and calcium sources Describe the need for physical activity post-operatively and will follow MD recommendations State when to call healthcare provider regarding medication questions or post-operative complications When having a diagnosis of diabetes understanding hypoglycemia symptoms and the inclusion of 1 complex carbohydrate per meal  Handouts given during class include: Pre-Op Bariatric Surgery Diet Handout Protein Shake Handout Post-Op Bariatric Surgery Nutrition Handout BELT Program Information Flyer Support Group Information Flyer [facility] Bariatric Supplements Price List  Follow-Up Plan: Patient will follow-up at [facility] 2 weeks post operatively for diet advancement per MD.

## 2021-03-30 ENCOUNTER — Ambulatory Visit: Payer: Self-pay | Admitting: General Surgery

## 2021-03-30 DIAGNOSIS — Z01818 Encounter for other preprocedural examination: Secondary | ICD-10-CM

## 2021-03-30 NOTE — Progress Notes (Signed)
Sent message, via epic in basket, requesting orders in epic from surgeon.  

## 2021-04-05 NOTE — Patient Instructions (Addendum)
DUE TO COVID-19 ONLY ONE VISITOR IS ALLOWED TO COME WITH YOU AND STAY IN THE WAITING ROOM ONLY DURING PRE OP AND PROCEDURE DAY OF SURGERY IF YOU ARE GOING HOME AFTER SURGERY. IF YOU ARE SPENDING THE NIGHT 2 PEOPLE MAY VISIT WITH YOU IN YOUR PRIVATE ROOM AFTER SURGERY UNTIL VISITING  HOURS ARE OVER AT 800 PM AND 1  VISITOR  MAY  SPEND THE NIGHT.                 Alexis Carr     Your procedure is scheduled on: 04/12/21   Report to [facility] Main  Entrance   Report to short stay at 5:15 AM     Call this number if you have problems the morning of surgery [phone]    NO SOLID FOOD AFTER 6:00 PM THE NIGHT BEFORE YOUR SURGERY.   YOU MAY DRINK CLEAR FLUIDS UNTIL 5:30 AM  THE G2 DRINK YOU DRINK BEFORE YOU LEAVE HOME WILL BE THE LAST FLUIDS YOU DRINK BEFORE SURGERY.  PAIN IS EXPECTED AFTER SURGERY AND WILL NOT BE COMPLETELY ELIMINATED.   AMBULATION AND TYLENOL WILL HELP REDUCE INCISIONAL AND GAS PAIN. MOVEMENT IS KEY!  YOU ARE EXPECTED TO BE OUT OF BED WITHIN 4 HOURS OF ADMISSION TO YOUR PATIENT ROOM.  SITTING IN THE RECLINER THROUGHOUT THE DAY IS IMPORTANT FOR DRINKING FLUIDS AND MOVING GAS THROUGHOUT THE GI TRACT.  COMPRESSION STOCKINGS SHOULD BE WORN [facility] STAY UNLESS YOU ARE WALKING.   INCENTIVE SPIROMETER SHOULD BE USED EVERY HOUR WHILE AWAKE TO DECREASE POST-OPERATIVE COMPLICATIONS SUCH AS PNEUMONIA.  WHEN DISCHARGED HOME, IT IS IMPORTANT TO CONTINUE TO WALK EVERY HOUR AND USE THE INCENTIVE SPIROMETER EVERY HOUR.       CLEAR LIQUID DIET   Foods Allowed                                                                     Foods Excluded  Coffee and tea, regular and decaf                             liquids that you cannot  Plain Jell-O any favor except red or purple                                           see through such as: Fruit ices (not with fruit pulp)                                     milk, soups, orange juice  Iced Popsicles                                     All solid food Carbonated beverages, regular and diet  Cranberry, grape and apple juices Sports drinks like Gatorade Lightly seasoned clear broth or consume(fat free) Sugar    BRUSH YOUR TEETH MORNING OF SURGERY AND RINSE YOUR MOUTH OUT, NO CHEWING GUM CANDY OR MINTS.     Take these medicines the morning of surgery with A SIP OF WATER: none                                You may not have any metal on your body including hair pins and              piercings  Do not wear jewelry, make-up, lotions, powders or perfumes, deodorant             Do not wear nail polish on your fingernails.  Do not shave  48 hours prior to surgery.               Do not bring valuables to the hospital. [facility] IS NOT             RESPONSIBLE   FOR VALUABLES.  Contacts, dentures or bridgework may not be worn into surgery.  Leave suitcase in the car. After surgery it may be brought to your room.                  Please read over the following fact sheets you were given: _____________________________________________________________________             [facility] - Preparing for Surgery Before surgery, you can play an important role.  Because skin is not sterile, your skin needs to be as free of germs as possible.  You can reduce the number of germs on your skin by washing with CHG (chlorahexidine gluconate) soap before surgery.  CHG is an antiseptic cleaner which kills germs and bonds with the skin to continue killing germs even after washing. Please DO NOT use if you have an allergy to CHG or antibacterial soaps.  If your skin becomes reddened/irritated stop using the CHG and inform your nurse when you arrive at Short Stay. Do not shave (including legs and underarms) for at least 48 hours prior to the first CHG shower.   Please follow these instructions carefully:  1.  Shower with CHG Soap the night before surgery and the  morning of  Surgery.  2.  If you choose to wash your hair, wash your hair first as usual with your  normal  shampoo.  3.  After you shampoo, rinse your hair and body thoroughly to remove the  shampoo.                            4.  Use CHG as you would any other liquid soap.  You can apply chg directly  to the skin and wash                       Gently with a scrungie or clean washcloth.  5.  Apply the CHG Soap to your body ONLY FROM THE NECK DOWN.   Do not use on face/ open                           Wound or open sores. Avoid contact with eyes, ears mouth and genitals (private parts).  Wash face,  Genitals (private parts) with your normal soap.             6.  Wash thoroughly, paying special attention to the area where your surgery  will be performed.  7.  Thoroughly rinse your body with warm water from the neck down.  8.  DO NOT shower/wash with your normal soap after using and rinsing off  the CHG Soap.                9.  Pat yourself dry with a clean towel.            10.  Wear clean pajamas.            11.  Place clean sheets on your bed the night of your first shower and do not  sleep with pets. Day of Surgery : Do not apply any lotions/deodorants the morning of surgery.  Please wear clean clothes to the hospital/surgery center.  FAILURE TO FOLLOW THESE INSTRUCTIONS MAY RESULT IN THE CANCELLATION OF YOUR SURGERY PATIENT SIGNATURE_________________________________  NURSE SIGNATURE__________________________________  ________________________________________________________________________   Alexis Carr  An incentive spirometer is a tool that can help keep your lungs clear and active. This tool measures how well you are filling your lungs with each breath. Taking long deep breaths may help reverse or decrease the chance of developing breathing (pulmonary) problems (especially infection) following: A long period of time when you are unable to move or be active. BEFORE  THE PROCEDURE  If the spirometer includes an indicator to show your best effort, your nurse or respiratory therapist will set it to a desired goal. If possible, sit up straight or lean slightly forward. Try not to slouch. Hold the incentive spirometer in an upright position. INSTRUCTIONS FOR USE  Sit on the edge of your bed if possible, or sit up as far as you can in bed or on a chair. Hold the incentive spirometer in an upright position. Breathe out normally. Place the mouthpiece in your mouth and seal your lips tightly around it. Breathe in slowly and as deeply as possible, raising the piston or the ball toward the top of the column. Hold your breath for 3-5 seconds or for as long as possible. Allow the piston or ball to fall to the bottom of the column. Remove the mouthpiece from your mouth and breathe out normally. Rest for a few seconds and repeat Steps 1 through 7 at least 10 times every 1-2 hours when you are awake. Take your time and take a few normal breaths between deep breaths. The spirometer may include an indicator to show your best effort. Use the indicator as a goal to work toward during each repetition. After each set of 10 deep breaths, practice coughing to be sure your lungs are clear. If you have an incision (the cut made at the time of surgery), support your incision when coughing by placing a pillow or rolled up towels firmly against it. Once you are able to get out of bed, walk around indoors and cough well. You may stop using the incentive spirometer when instructed by your caregiver.  RISKS AND COMPLICATIONS Take your time so you do not get dizzy or light-headed. If you are in pain, you may need to take or ask for pain medication before doing incentive spirometry. It is harder to take a deep breath if you are having pain. AFTER USE Rest and breathe slowly and easily. It can be helpful to keep track of  a log of your progress. Your caregiver can provide you with a simple  table to help with this. If you are using the spirometer at home, follow these instructions: SEEK MEDICAL CARE IF:  You are having difficultly using the spirometer. You have trouble using the spirometer as often as instructed. Your pain medication is not giving enough relief while using the spirometer. You develop fever of 100.5 F (38.1 C) or higher. SEEK IMMEDIATE MEDICAL CARE IF:  You cough up bloody sputum that had not been present before. You develop fever of 102 F (38.9 C) or greater. You develop worsening pain at or near the incision site. MAKE SURE YOU:  Understand these instructions. Will watch your condition. Will get help right away if you are not doing well or get worse. Document Released: 09/11/2006 Document Revised: 07/24/2011 Document Reviewed: 11/12/2006 [facility] Patient Information 2014 [place], [place].   ________________________________________________________________________

## 2021-04-06 ENCOUNTER — Other Ambulatory Visit: Payer: Self-pay

## 2021-04-06 ENCOUNTER — Encounter (HOSPITAL_COMMUNITY)
Admission: RE | Admit: 2021-04-06 | Discharge: 2021-04-06 | Disposition: A | Discharge status: Home / Self Care | Payer: BC Managed Care – PPO | Source: Ambulatory Visit | Attending: General Surgery | Admitting: General Surgery

## 2021-04-06 ENCOUNTER — Encounter (HOSPITAL_COMMUNITY): Payer: Self-pay

## 2021-04-06 VITALS — BP 137/82 | HR 69 | Temp 98.3°F | Resp 18 | Ht 65.5 in | Wt 230.0 lb

## 2021-04-06 DIAGNOSIS — Z01818 Encounter for other preprocedural examination: Secondary | ICD-10-CM

## 2021-04-06 DIAGNOSIS — Z01812 Encounter for preprocedural laboratory examination: Secondary | ICD-10-CM | POA: Insufficient documentation

## 2021-04-06 HISTORY — DX: Sleep apnea, unspecified: G47.30

## 2021-04-06 HISTORY — DX: Personal history of other diseases of the digestive system: Z87.19

## 2021-04-06 LAB — CBC WITH DIFFERENTIAL/PLATELET
Abs Immature Granulocytes: 0.01 10*3/uL (ref 0.00–0.07)
Basophils Absolute: 0 10*3/uL (ref 0.0–0.1)
Basophils Relative: 1 %
Eosinophils Absolute: 0.1 10*3/uL (ref 0.0–0.5)
Eosinophils Relative: 1 %
HCT: 37 % (ref 36.0–46.0)
Hemoglobin: 12 g/dL (ref 12.0–15.0)
Immature Granulocytes: 0 %
Lymphocytes Relative: 31 %
Lymphs Abs: 1.8 10*3/uL (ref 0.7–4.0)
MCH: 30.6 pg (ref 26.0–34.0)
MCHC: 32.4 g/dL (ref 30.0–36.0)
MCV: 94.4 fL (ref 80.0–100.0)
Monocytes Absolute: 0.5 10*3/uL (ref 0.1–1.0)
Monocytes Relative: 10 %
Neutro Abs: 3.2 10*3/uL (ref 1.7–7.7)
Neutrophils Relative %: 57 %
Platelets: 150 10*3/uL (ref 150–400)
RBC: 3.92 MIL/uL (ref 3.87–5.11)
RDW: 12.7 % (ref 11.5–15.5)
WBC: 5.6 10*3/uL (ref 4.0–10.5)
nRBC: 0 % (ref 0.0–0.2)

## 2021-04-06 LAB — COMPREHENSIVE METABOLIC PANEL
ALT: 28 U/L (ref 0–44)
AST: 25 U/L (ref 15–41)
Albumin: 4.1 g/dL (ref 3.5–5.0)
Alkaline Phosphatase: 89 U/L (ref 38–126)
Anion gap: 7 (ref 5–15)
BUN: 17 mg/dL (ref 6–20)
CO2: 28 mmol/L (ref 22–32)
Calcium: 9.5 mg/dL (ref 8.9–10.3)
Chloride: 103 mmol/L (ref 98–111)
Creatinine, Ser: 0.65 mg/dL (ref 0.44–1.00)
GFR, Estimated: >60 mL/min (ref >60)
Glucose, Bld: 89 mg/dL (ref 70–99)
Potassium: 4.2 mmol/L (ref 3.5–5.1)
Sodium: 138 mmol/L (ref 135–145)
Total Bilirubin: 1 mg/dL (ref 0.3–1.2)
Total Protein: 7.9 g/dL (ref 6.5–8.1)

## 2021-04-06 NOTE — Progress Notes (Signed)
COVID test - DOS  PCP - Dr. Herma Carson Cardiologist - none now  Chest x-ray - no EKG - 02/08/21-epic Stress Test - 2000- done in [place] because of chest pressure. Pt reports that results were WNL ECHO - 2000 Cardiac Cath - NA Pacemaker/ICD device last checked:NA  Sleep Study - yes CPAP - yes  Fasting Blood Sugar - NA Checks Blood Sugar _____ times a day  Blood Thinner Instructions:NA Aspirin Instructions: Last Dose:  Anesthesia review: yes  Patient denies shortness of breath, fever, cough and chest pain at PAT appointment Pt has no SOB with climbing stairs, doing housework or with ADLs  Patient verbalized understanding of instructions that were given to them at the PAT appointment. Patient was also instructed that they will need to review over the PAT instructions again at home before surgery. yes

## 2021-04-11 NOTE — Anesthesia Preprocedure Evaluation (Addendum)
Anesthesia Evaluation  Patient identified by MRN, date of birth, ID band Patient awake    Reviewed: Allergy & Precautions, NPO status , Patient's Chart, lab work & pertinent test results  History of Anesthesia Complications Negative for: history of anesthetic complications  Airway Mallampati: III  TM Distance: >3 FB Neck ROM: Full    Dental  (+) Teeth Intact, Dental Advisory Given   Pulmonary sleep apnea and Continuous Positive Airway Pressure Ventilation ,    Pulmonary exam normal breath sounds clear to auscultation       Cardiovascular negative cardio ROS Normal cardiovascular exam Rhythm:Regular Rate:Normal     Neuro/Psych negative neurological ROS     GI/Hepatic Neg liver ROS, hiatal hernia,   Endo/Other  Obesity   Renal/GU negative Renal ROS     Musculoskeletal  (+) Arthritis ,   Abdominal   Peds  (+) ATTENTION DEFICIT DISORDER WITHOUT HYPERACTIVITY Hematology negative hematology ROS (+)   Anesthesia Other Findings   Reproductive/Obstetrics                            Anesthesia Physical Anesthesia Plan  ASA: 2  Anesthesia Plan: General   Post-op Pain Management: Tylenol PO (pre-op) and Gabapentin PO (pre-op)   Induction:   PONV Risk Score and Plan: 4 or greater and Midazolam, Dexamethasone, Ondansetron and Scopolamine patch - Pre-op  Airway Management Planned: Oral ETT  Additional Equipment:   Intra-op Plan:   Post-operative Plan: Extubation in OR  Informed Consent: I have reviewed the patients History and Physical, chart, labs and discussed the procedure including the risks, benefits and alternatives for the proposed anesthesia with the patient or authorized representative who has indicated his/her understanding and acceptance.     Dental advisory given  Plan Discussed with: CRNA  Anesthesia Plan Comments:        Anesthesia Quick Evaluation

## 2021-04-12 ENCOUNTER — Inpatient Hospital Stay (HOSPITAL_COMMUNITY): Payer: BC Managed Care – PPO | Admitting: Anesthesiology

## 2021-04-12 ENCOUNTER — Other Ambulatory Visit: Payer: Self-pay

## 2021-04-12 ENCOUNTER — Encounter (HOSPITAL_COMMUNITY)
Admission: RE | Disposition: A | Discharge status: Home / Self Care | Payer: Self-pay | Source: Home / Self Care | Attending: General Surgery

## 2021-04-12 ENCOUNTER — Inpatient Hospital Stay (HOSPITAL_COMMUNITY)
Admission: RE | Admit: 2021-04-12 | Discharge: 2021-04-13 | DRG: 621 | Disposition: A | Discharge status: Home / Self Care | Payer: BC Managed Care – PPO | Attending: General Surgery | Admitting: General Surgery

## 2021-04-12 ENCOUNTER — Encounter (HOSPITAL_COMMUNITY): Payer: Self-pay | Admitting: General Surgery

## 2021-04-12 DIAGNOSIS — G4733 Obstructive sleep apnea (adult) (pediatric): Secondary | ICD-10-CM | POA: Diagnosis present

## 2021-04-12 DIAGNOSIS — Z01818 Encounter for other preprocedural examination: Secondary | ICD-10-CM

## 2021-04-12 DIAGNOSIS — Z6838 Body mass index (BMI) 38.0-38.9, adult: Secondary | ICD-10-CM | POA: Diagnosis not present

## 2021-04-12 DIAGNOSIS — G43909 Migraine, unspecified, not intractable, without status migrainosus: Secondary | ICD-10-CM | POA: Diagnosis present

## 2021-04-12 DIAGNOSIS — M47812 Spondylosis without myelopathy or radiculopathy, cervical region: Secondary | ICD-10-CM | POA: Diagnosis present

## 2021-04-12 DIAGNOSIS — Z9884 Bariatric surgery status: Secondary | ICD-10-CM

## 2021-04-12 DIAGNOSIS — Z79899 Other long term (current) drug therapy: Secondary | ICD-10-CM | POA: Diagnosis not present

## 2021-04-12 DIAGNOSIS — K219 Gastro-esophageal reflux disease without esophagitis: Secondary | ICD-10-CM | POA: Diagnosis present

## 2021-04-12 DIAGNOSIS — Z20822 Contact with and (suspected) exposure to covid-19: Secondary | ICD-10-CM | POA: Diagnosis present

## 2021-04-12 DIAGNOSIS — K449 Diaphragmatic hernia without obstruction or gangrene: Secondary | ICD-10-CM | POA: Diagnosis present

## 2021-04-12 DIAGNOSIS — M170 Bilateral primary osteoarthritis of knee: Secondary | ICD-10-CM | POA: Diagnosis present

## 2021-04-12 DIAGNOSIS — F988 Other specified behavioral and emotional disorders with onset usually occurring in childhood and adolescence: Secondary | ICD-10-CM | POA: Diagnosis present

## 2021-04-12 HISTORY — PX: GASTRIC ROUX-EN-Y: SHX5262

## 2021-04-12 HISTORY — PX: HIATAL HERNIA REPAIR: SHX195

## 2021-04-12 HISTORY — DX: Bariatric surgery status: Z98.84

## 2021-04-12 LAB — TYPE AND SCREEN
ABO/RH(D): O POS
Antibody Screen: NEGATIVE

## 2021-04-12 LAB — ABO/RH: ABO/RH(D): O POS

## 2021-04-12 LAB — PREGNANCY, URINE: Preg Test, Ur: NEGATIVE

## 2021-04-12 LAB — SARS CORONAVIRUS 2 BY RT PCR (HOSPITAL ORDER, PERFORMED IN ~~LOC~~ HOSPITAL LAB): SARS Coronavirus 2: NEGATIVE

## 2021-04-12 LAB — HEMOGLOBIN AND HEMATOCRIT, BLOOD
HCT: 35.9 % — ABNORMAL LOW (ref 36.0–46.0)
Hemoglobin: 11.7 g/dL — ABNORMAL LOW (ref 12.0–15.0)

## 2021-04-12 SURGERY — LAPAROSCOPIC ROUX-EN-Y GASTRIC BYPASS WITH UPPER ENDOSCOPY
Anesthesia: General | Site: Abdomen

## 2021-04-12 MED ORDER — ENSURE MAX PROTEIN PO LIQD
2.0000 [oz_av] | ORAL | Status: DC
  Administered 2021-04-13 (×4): 2 [oz_av] via ORAL

## 2021-04-12 MED ORDER — FENTANYL CITRATE PF 50 MCG/ML IJ SOSY: 25.0000 ug | PREFILLED_SYRINGE | INTRAMUSCULAR | Status: DC | PRN

## 2021-04-12 MED ORDER — SUCCINYLCHOLINE CHLORIDE 200 MG/10ML IV SOSY
PREFILLED_SYRINGE | INTRAVENOUS | Status: AC
  Filled 2021-04-12: qty 20

## 2021-04-12 MED ORDER — LACTATED RINGERS IR SOLN
Status: DC | PRN
  Administered 2021-04-12: 1000 mL

## 2021-04-12 MED ORDER — FIBRIN SEALANT 2 ML SINGLE DOSE KIT
PACK | CUTANEOUS | Status: DC | PRN
  Administered 2021-04-12: 2 mL via TOPICAL

## 2021-04-12 MED ORDER — MIDAZOLAM HCL 5 MG/5ML IJ SOLN
INTRAMUSCULAR | Status: DC | PRN
  Administered 2021-04-12: 2 mg via INTRAVENOUS

## 2021-04-12 MED ORDER — KCL IN DEXTROSE-NACL 20-5-0.45 MEQ/L-%-% IV SOLN
INTRAVENOUS | Status: DC
  Filled 2021-04-12 (×2): qty 1000

## 2021-04-12 MED ORDER — ONDANSETRON HCL 4 MG/2ML IJ SOLN: 4.0000 mg | Freq: Four times a day (QID) | INTRAMUSCULAR | Status: DC | PRN

## 2021-04-12 MED ORDER — CHLORHEXIDINE GLUCONATE 4 % EX LIQD: 60.0000 mL | Freq: Once | CUTANEOUS | Status: DC

## 2021-04-12 MED ORDER — PROMETHAZINE HCL 25 MG/ML IJ SOLN
6.2500 mg | INTRAMUSCULAR | Status: DC | PRN
  Administered 2021-04-12: 6.25 mg via INTRAVENOUS

## 2021-04-12 MED ORDER — CEFAZOLIN SODIUM-DEXTROSE 2-4 GM/100ML-% IV SOLN
INTRAVENOUS | Status: AC
  Filled 2021-04-12: qty 100

## 2021-04-12 MED ORDER — FIBRIN SEALANT 2 ML SINGLE DOSE KIT
2.0000 mL | PACK | Freq: Once | CUTANEOUS | Status: DC
  Filled 2021-04-12: qty 2

## 2021-04-12 MED ORDER — ROCURONIUM BROMIDE 10 MG/ML (PF) SYRINGE
PREFILLED_SYRINGE | INTRAVENOUS | Status: AC
  Filled 2021-04-12: qty 10

## 2021-04-12 MED ORDER — GABAPENTIN 300 MG PO CAPS
300.0000 mg | ORAL_CAPSULE | ORAL | Status: AC
  Administered 2021-04-12: 300 mg via ORAL

## 2021-04-12 MED ORDER — KETAMINE HCL 10 MG/ML IJ SOLN
INTRAMUSCULAR | Status: AC
  Filled 2021-04-12: qty 1

## 2021-04-12 MED ORDER — FENTANYL CITRATE (PF) 100 MCG/2ML IJ SOLN
INTRAMUSCULAR | Status: DC | PRN
  Administered 2021-04-12: 50 ug via INTRAVENOUS
  Administered 2021-04-12: 100 ug via INTRAVENOUS

## 2021-04-12 MED ORDER — PHENYLEPHRINE HCL (PRESSORS) 10 MG/ML IV SOLN
INTRAVENOUS | Status: AC
  Filled 2021-04-12: qty 1

## 2021-04-12 MED ORDER — LACTATED RINGERS IV SOLN: INTRAVENOUS | Status: DC

## 2021-04-12 MED ORDER — LIDOCAINE 2% (20 MG/ML) 5 ML SYRINGE
INTRAMUSCULAR | Status: DC | PRN
Start: 2021-04-12 — End: 2021-04-12
  Administered 2021-04-12: 80 mg via INTRAVENOUS

## 2021-04-12 MED ORDER — BUPIVACAINE LIPOSOME 1.3 % IJ SUSP
INTRAMUSCULAR | Status: DC | PRN
  Administered 2021-04-12: 20 mL

## 2021-04-12 MED ORDER — HYDRALAZINE HCL 20 MG/ML IJ SOLN: 10.0000 mg | INTRAMUSCULAR | Status: DC | PRN

## 2021-04-12 MED ORDER — ORAL CARE MOUTH RINSE: 15.0000 mL | Freq: Once | OROMUCOSAL | Status: AC

## 2021-04-12 MED ORDER — SODIUM CHLORIDE (PF) 0.9 % IJ SOLN
INTRAMUSCULAR | Status: DC | PRN
  Administered 2021-04-12: 50 mL

## 2021-04-12 MED ORDER — DEXAMETHASONE SODIUM PHOSPHATE 4 MG/ML IJ SOLN
4.0000 mg | INTRAMUSCULAR | Status: AC
  Administered 2021-04-12: 10 mg via INTRAVENOUS

## 2021-04-12 MED ORDER — ACETAMINOPHEN 500 MG PO TABS
1000.0000 mg | ORAL_TABLET | Freq: Three times a day (TID) | ORAL | Status: DC
  Administered 2021-04-13: 1000 mg via ORAL
  Filled 2021-04-12: qty 2

## 2021-04-12 MED ORDER — PROPOFOL 10 MG/ML IV BOLUS
INTRAVENOUS | Status: DC | PRN
  Administered 2021-04-12: 160 mg via INTRAVENOUS

## 2021-04-12 MED ORDER — 0.9 % SODIUM CHLORIDE (POUR BTL) OPTIME
TOPICAL | Status: DC | PRN
  Administered 2021-04-12: 1000 mL

## 2021-04-12 MED ORDER — "VISTASEAL 4 ML SINGLE DOSE KIT "
4.0000 mL | PACK | Freq: Once | CUTANEOUS | Status: DC
  Filled 2021-04-12: qty 4

## 2021-04-12 MED ORDER — SUCCINYLCHOLINE CHLORIDE 200 MG/10ML IV SOSY
PREFILLED_SYRINGE | INTRAVENOUS | Status: AC
  Filled 2021-04-12: qty 10

## 2021-04-12 MED ORDER — ROCURONIUM BROMIDE 10 MG/ML (PF) SYRINGE
PREFILLED_SYRINGE | INTRAVENOUS | Status: DC | PRN
  Administered 2021-04-12: 40 mg via INTRAVENOUS
  Administered 2021-04-12: 60 mg via INTRAVENOUS

## 2021-04-12 MED ORDER — MIDAZOLAM HCL 2 MG/2ML IJ SOLN
INTRAMUSCULAR | Status: AC
  Filled 2021-04-12: qty 2

## 2021-04-12 MED ORDER — PROPOFOL 10 MG/ML IV BOLUS
INTRAVENOUS | Status: AC
  Filled 2021-04-12: qty 20

## 2021-04-12 MED ORDER — PHENYLEPHRINE HCL-NACL 20-0.9 MG/250ML-% IV SOLN
INTRAVENOUS | Status: DC | PRN
  Administered 2021-04-12: 15 ug/min via INTRAVENOUS

## 2021-04-12 MED ORDER — SUGAMMADEX SODIUM 200 MG/2ML IV SOLN
INTRAVENOUS | Status: DC | PRN
  Administered 2021-04-12: 200 mg via INTRAVENOUS

## 2021-04-12 MED ORDER — GABAPENTIN 300 MG PO CAPS
ORAL_CAPSULE | ORAL | Status: AC
  Filled 2021-04-12: qty 1

## 2021-04-12 MED ORDER — HEPARIN SODIUM (PORCINE) 5000 UNIT/ML IJ SOLN
INTRAMUSCULAR | Status: AC
  Filled 2021-04-12: qty 1

## 2021-04-12 MED ORDER — BUPIVACAINE LIPOSOME 1.3 % IJ SUSP: 20.0000 mL | Freq: Once | INTRAMUSCULAR | Status: DC

## 2021-04-12 MED ORDER — ONDANSETRON HCL 4 MG/2ML IJ SOLN
INTRAMUSCULAR | Status: AC
  Filled 2021-04-12: qty 2

## 2021-04-12 MED ORDER — LIDOCAINE HCL (PF) 2 % IJ SOLN
INTRAMUSCULAR | Status: DC | PRN
  Administered 2021-04-12: 1.5 mg/kg/h via INTRADERMAL

## 2021-04-12 MED ORDER — OXYCODONE HCL 5 MG/5ML PO SOLN: 5.0000 mg | Freq: Four times a day (QID) | ORAL | Status: DC | PRN

## 2021-04-12 MED ORDER — PANTOPRAZOLE SODIUM 40 MG IV SOLR: 40.0000 mg | Freq: Every day | INTRAVENOUS | Status: DC

## 2021-04-12 MED ORDER — ONDANSETRON HCL 4 MG/2ML IJ SOLN
INTRAMUSCULAR | Status: DC | PRN
  Administered 2021-04-12: 4 mg via INTRAVENOUS

## 2021-04-12 MED ORDER — SODIUM CHLORIDE 0.9 % IV SOLN
12.5000 mg | Freq: Four times a day (QID) | INTRAVENOUS | Status: DC | PRN
  Filled 2021-04-12: qty 0.5

## 2021-04-12 MED ORDER — CHLORHEXIDINE GLUCONATE 0.12 % MT SOLN
15.0000 mL | Freq: Once | OROMUCOSAL | Status: AC
  Administered 2021-04-12: 15 mL via OROMUCOSAL

## 2021-04-12 MED ORDER — DEXAMETHASONE SODIUM PHOSPHATE 10 MG/ML IJ SOLN
INTRAMUSCULAR | Status: AC
  Filled 2021-04-12: qty 1

## 2021-04-12 MED ORDER — EPHEDRINE 5 MG/ML INJ
INTRAVENOUS | Status: AC
  Filled 2021-04-12: qty 5

## 2021-04-12 MED ORDER — EPHEDRINE SULFATE-NACL 50-0.9 MG/10ML-% IV SOSY
PREFILLED_SYRINGE | INTRAVENOUS | Status: DC | PRN
  Administered 2021-04-12: 5 mg via INTRAVENOUS

## 2021-04-12 MED ORDER — SCOPOLAMINE 1 MG/3DAYS TD PT72
MEDICATED_PATCH | TRANSDERMAL | Status: AC
  Filled 2021-04-12: qty 1

## 2021-04-12 MED ORDER — ACETAMINOPHEN 160 MG/5ML PO SOLN: 1000.0000 mg | Freq: Three times a day (TID) | ORAL | Status: DC

## 2021-04-12 MED ORDER — PROMETHAZINE HCL 25 MG/ML IJ SOLN
INTRAMUSCULAR | Status: AC
  Filled 2021-04-12: qty 1

## 2021-04-12 MED ORDER — PANTOPRAZOLE SODIUM 40 MG IV SOLR
INTRAVENOUS | Status: AC
  Administered 2021-04-12: 40 mg
  Filled 2021-04-12: qty 40

## 2021-04-12 MED ORDER — OXYCODONE HCL 5 MG/5ML PO SOLN
ORAL | Status: AC
  Administered 2021-04-12: 5 mg
  Filled 2021-04-12: qty 5

## 2021-04-12 MED ORDER — APREPITANT 40 MG PO CAPS
ORAL_CAPSULE | ORAL | Status: AC
  Filled 2021-04-12: qty 1

## 2021-04-12 MED ORDER — ENOXAPARIN SODIUM 30 MG/0.3ML IJ SOSY
30.0000 mg | PREFILLED_SYRINGE | Freq: Two times a day (BID) | INTRAMUSCULAR | Status: DC
  Administered 2021-04-12 – 2021-04-13 (×2): 30 mg via SUBCUTANEOUS
  Filled 2021-04-12: qty 0.3

## 2021-04-12 MED ORDER — KETAMINE HCL 10 MG/ML IJ SOLN
INTRAMUSCULAR | Status: DC | PRN
  Administered 2021-04-12: 30 mg via INTRAVENOUS

## 2021-04-12 MED ORDER — ACETAMINOPHEN 500 MG PO TABS
ORAL_TABLET | ORAL | Status: AC
  Filled 2021-04-12: qty 2

## 2021-04-12 MED ORDER — DIPHENHYDRAMINE HCL 50 MG/ML IJ SOLN: 12.5000 mg | Freq: Three times a day (TID) | INTRAMUSCULAR | Status: DC | PRN

## 2021-04-12 MED ORDER — "VISTASEAL 4 ML SINGLE DOSE KIT "
PACK | CUTANEOUS | Status: DC | PRN
  Administered 2021-04-12: 4 mL via TOPICAL

## 2021-04-12 MED ORDER — MORPHINE SULFATE (PF) 2 MG/ML IV SOLN: 1.0000 mg | INTRAVENOUS | Status: DC | PRN

## 2021-04-12 MED ORDER — SCOPOLAMINE 1 MG/3DAYS TD PT72
1.0000 | MEDICATED_PATCH | TRANSDERMAL | Status: DC
  Administered 2021-04-12: 1.5 mg via TRANSDERMAL

## 2021-04-12 MED ORDER — APREPITANT 40 MG PO CAPS
40.0000 mg | ORAL_CAPSULE | ORAL | Status: AC
  Administered 2021-04-12: 40 mg via ORAL

## 2021-04-12 MED ORDER — SIMETHICONE 80 MG PO CHEW: 80.0000 mg | CHEWABLE_TABLET | Freq: Four times a day (QID) | ORAL | Status: DC | PRN

## 2021-04-12 MED ORDER — HEPARIN SODIUM (PORCINE) 5000 UNIT/ML IJ SOLN
5000.0000 [IU] | INTRAMUSCULAR | Status: AC
  Administered 2021-04-12: 5000 [IU] via SUBCUTANEOUS

## 2021-04-12 MED ORDER — FENTANYL CITRATE (PF) 250 MCG/5ML IJ SOLN
INTRAMUSCULAR | Status: AC
  Filled 2021-04-12: qty 5

## 2021-04-12 MED ORDER — ACETAMINOPHEN 500 MG PO TABS
1000.0000 mg | ORAL_TABLET | ORAL | Status: AC
  Administered 2021-04-12: 1000 mg via ORAL

## 2021-04-12 MED ORDER — SODIUM CHLORIDE 0.9 % IV SOLN
2.0000 g | INTRAVENOUS | Status: AC
  Administered 2021-04-12: 2 g via INTRAVENOUS

## 2021-04-12 SURGICAL SUPPLY — 117 items
APL LAPSCP 35 DL APL RGD (MISCELLANEOUS) ×2
APL PRP STRL LF DISP 70% ISPRP (MISCELLANEOUS) ×1
APL SWBSTK 6 STRL LF DISP (MISCELLANEOUS) ×1
APPLICATOR COTTON TIP 6 STRL (MISCELLANEOUS) ×1 IMPLANT
APPLICATOR COTTON TIP 6IN STRL (MISCELLANEOUS) ×3
APPLICATOR VISTASEAL 35 (MISCELLANEOUS) ×6 IMPLANT
APPLIER CLIP ROT 13.4 12 LRG (CLIP)
APR CLP LRG 13.4X12 ROT 20 MLT (CLIP)
BAG COUNTER SPONGE SURGICOUNT (BAG) IMPLANT
BAG SPNG CNTER NS LX DISP (BAG)
BAG SURGICOUNT SPONGE COUNTING (BAG)
BLADE EXTENDED COATED 6.5IN (ELECTRODE) IMPLANT
BLADE HEX COATED 2.75 (ELECTRODE) ×3 IMPLANT
BLADE SURG SZ11 CARB STEEL (BLADE) ×3 IMPLANT
BNDG ADH 1X3 SHEER STRL LF (GAUZE/BANDAGES/DRESSINGS) IMPLANT
BNDG ADH THN 3X1 STRL LF (GAUZE/BANDAGES/DRESSINGS)
CABLE HIGH FREQUENCY MONO STRZ (ELECTRODE) IMPLANT
CHLORAPREP W/TINT 26 (MISCELLANEOUS) ×4 IMPLANT
CLIP APPLIE ROT 13.4 12 LRG (CLIP) IMPLANT
CLIP SUT LAPRA TY ABSORB (SUTURE) ×6 IMPLANT
CLOSURE WOUND 1/2 X4 (GAUZE/BANDAGES/DRESSINGS) ×6
COVER MAYO STAND STRL (DRAPES) IMPLANT
CUTTER FLEX LINEAR 45M (STAPLE) IMPLANT
DEVICE SUT QUICK LOAD TK 5 (STAPLE) ×1 IMPLANT
DEVICE SUT TI-KNOT TK 5X26 (MISCELLANEOUS) ×1 IMPLANT
DEVICE SUTURE ENDOST 10MM (ENDOMECHANICALS) ×3 IMPLANT
DEVICE TI KNOT TK5 (MISCELLANEOUS) ×1
DRAIN PENROSE 0.25X18 (DRAIN) ×3 IMPLANT
DRAIN PENROSE 0.5X18 (DRAIN) ×1 IMPLANT
DRAPE LAPAROSCOPIC ABDOMINAL (DRAPES) ×1 IMPLANT
DRAPE UTILITY XL STRL (DRAPES) ×3 IMPLANT
DRAPE WARM FLUID 44X44 (DRAPES) IMPLANT
DRSG TEGADERM 2-3/8X2-3/4 SM (GAUZE/BANDAGES/DRESSINGS) ×3 IMPLANT
ELECT REM PT RETURN 15FT ADLT (MISCELLANEOUS) ×3 IMPLANT
GAUZE 4X4 16PLY ~~LOC~~+RFID DBL (SPONGE) ×3 IMPLANT
GAUZE SPONGE 2X2 8PLY STRL LF (GAUZE/BANDAGES/DRESSINGS) ×1 IMPLANT
GAUZE SPONGE 4X4 12PLY STRL (GAUZE/BANDAGES/DRESSINGS) ×3 IMPLANT
GLOVE SRG 8 PF TXTR STRL LF DI (GLOVE) ×2 IMPLANT
GLOVE SURG ENC TEXT LTX SZ7.5 (GLOVE) ×3 IMPLANT
GLOVE SURG NEOPR MICRO LF SZ8 (GLOVE) ×3 IMPLANT
GLOVE SURG POLY ORTHO LF SZ7.5 (GLOVE) ×6 IMPLANT
GLOVE SURG UNDER POLY LF SZ7 (GLOVE) ×3 IMPLANT
GLOVE SURG UNDER POLY LF SZ8 (GLOVE) ×6
GOWN STRL REUS W/TWL LRG LVL3 (GOWN DISPOSABLE) ×3 IMPLANT
GOWN STRL REUS W/TWL XL LVL3 (GOWN DISPOSABLE) ×12 IMPLANT
HANDLE SUCTION POOLE (INSTRUMENTS) IMPLANT
IRRIG SUCT STRYKERFLOW 2 WTIP (MISCELLANEOUS) ×3
IRRIGATION SUCT STRKRFLW 2 WTP (MISCELLANEOUS) ×1 IMPLANT
KIT BASIN OR (CUSTOM PROCEDURE TRAY) ×3 IMPLANT
KIT GASTRIC LAVAGE 34FR ADT (SET/KITS/TRAYS/PACK) ×3 IMPLANT
KIT TURNOVER KIT A (KITS) ×2 IMPLANT
MARKER SKIN DUAL TIP RULER LAB (MISCELLANEOUS) ×3 IMPLANT
MAT PREVALON FULL STRYKER (MISCELLANEOUS) ×3 IMPLANT
NDL SPNL 22GX3.5 QUINCKE BK (NEEDLE) ×1 IMPLANT
NEEDLE SPNL 22GX3.5 QUINCKE BK (NEEDLE) ×3 IMPLANT
NS IRRIG 1000ML POUR BTL (IV SOLUTION) ×3 IMPLANT
PACK CARDIOVASCULAR III (CUSTOM PROCEDURE TRAY) ×3 IMPLANT
PACK GENERAL/GYN (CUSTOM PROCEDURE TRAY) ×3 IMPLANT
PENCIL SMOKE EVACUATOR (MISCELLANEOUS) IMPLANT
QUICK LOAD TK 5 (STAPLE) ×1
RELOAD 45 VASCULAR/THIN (ENDOMECHANICALS) IMPLANT
RELOAD ENDO STITCH 2.0 (ENDOMECHANICALS) ×27
RELOAD STAPLE 45 2.5 WHT GRN (ENDOMECHANICALS) IMPLANT
RELOAD STAPLE 45 3.5 BLU ETS (ENDOMECHANICALS) IMPLANT
RELOAD STAPLE 60 2.6 WHT THN (STAPLE) ×2 IMPLANT
RELOAD STAPLE 60 3.6 BLU REG (STAPLE) ×2 IMPLANT
RELOAD STAPLE 60 3.8 GOLD REG (STAPLE) ×1 IMPLANT
RELOAD STAPLE TA45 3.5 REG BLU (ENDOMECHANICALS) IMPLANT
RELOAD STAPLER BLUE 60MM (STAPLE) ×4 IMPLANT
RELOAD STAPLER GOLD 60MM (STAPLE) ×1 IMPLANT
RELOAD STAPLER WHITE 60MM (STAPLE) ×3 IMPLANT
RELOAD SUT SNGL STCH ABSRB 2-0 (ENDOMECHANICALS) ×5 IMPLANT
RELOAD SUT SNGL STCH BLK 2-0 (ENDOMECHANICALS) ×4 IMPLANT
SCISSORS LAP 5X45 EPIX DISP (ENDOMECHANICALS) ×3 IMPLANT
SET TUBE SMOKE EVAC HIGH FLOW (TUBING) ×3 IMPLANT
SHEARS HARMONIC ACE PLUS 45CM (MISCELLANEOUS) ×3 IMPLANT
SLEEVE XCEL OPT CAN 5 100 (ENDOMECHANICALS) ×3 IMPLANT
SOL ANTI FOG 6CC (MISCELLANEOUS) ×1 IMPLANT
SOLUTION ANTI FOG 6CC (MISCELLANEOUS) ×2
SPONGE GAUZE 2X2 STER 10/PKG (GAUZE/BANDAGES/DRESSINGS) ×2
SPONGE T-LAP 18X18 ~~LOC~~+RFID (SPONGE) IMPLANT
STAPLER ECHELON BIOABSB 60 FLE (MISCELLANEOUS) IMPLANT
STAPLER ECHELON LONG 60 440 (INSTRUMENTS) ×3 IMPLANT
STAPLER RELOAD BLUE 60MM (STAPLE) ×12
STAPLER RELOAD GOLD 60MM (STAPLE) ×3
STAPLER RELOAD WHITE 60MM (STAPLE) ×9
STAPLER VISISTAT 35W (STAPLE) ×3 IMPLANT
STRIP CLOSURE SKIN 1/2X4 (GAUZE/BANDAGES/DRESSINGS) ×12 IMPLANT
SUCTION POOLE HANDLE (INSTRUMENTS)
SURGILUBE 2OZ TUBE FLIPTOP (MISCELLANEOUS) ×3 IMPLANT
SUT MNCRL AB 4-0 PS2 18 (SUTURE) ×3 IMPLANT
SUT PDS AB 1 CTX 36 (SUTURE) IMPLANT
SUT RELOAD ENDO STITCH 2 48X1 (ENDOMECHANICALS) ×5
SUT RELOAD ENDO STITCH 2.0 (ENDOMECHANICALS) ×4
SUT SILK 2 0 (SUTURE)
SUT SILK 2 0 SH CR/8 (SUTURE) IMPLANT
SUT SILK 2-0 18XBRD TIE 12 (SUTURE) IMPLANT
SUT SILK 3 0 (SUTURE)
SUT SILK 3 0 SH CR/8 (SUTURE) IMPLANT
SUT SILK 3-0 18XBRD TIE 12 (SUTURE) IMPLANT
SUT SURGIDAC NAB ES-9 0 48 120 (SUTURE) ×2 IMPLANT
SUT VIC AB 2-0 SH 27 (SUTURE) ×3
SUT VIC AB 2-0 SH 27X BRD (SUTURE) ×1 IMPLANT
SUT VICRYL 2 0 18  UND BR (SUTURE)
SUT VICRYL 2 0 18 UND BR (SUTURE) IMPLANT
SUTURE RELOAD END STTCH 2 48X1 (ENDOMECHANICALS) ×5 IMPLANT
SUTURE RELOAD ENDO STITCH 2.0 (ENDOMECHANICALS) ×4 IMPLANT
SYR 20ML LL LF (SYRINGE) ×6 IMPLANT
TOWEL OR 17X26 10 PK STRL BLUE (TOWEL DISPOSABLE) ×4 IMPLANT
TOWEL OR NON WOVEN STRL DISP B (DISPOSABLE) ×3 IMPLANT
TRAY FOLEY MTR SLVR 16FR STAT (SET/KITS/TRAYS/PACK) IMPLANT
TROCAR BLADELESS OPT 5 100 (ENDOMECHANICALS) ×3 IMPLANT
TROCAR UNIVERSAL OPT 12M 100M (ENDOMECHANICALS) ×9 IMPLANT
TROCAR XCEL 12X100 BLDLESS (ENDOMECHANICALS) ×3 IMPLANT
TUBING CONNECTING 10 (TUBING) ×3 IMPLANT
TUBING CONNECTING 10' (TUBING) ×1
YANKAUER SUCT BULB TIP NO VENT (SUCTIONS) IMPLANT

## 2021-04-12 NOTE — Anesthesia Postprocedure Evaluation (Signed)
Anesthesia Post Note  Patient: Alexis Carr  Procedure(s) Performed: LAPAROSCOPIC ROUX-EN-Y GASTRIC BYPASS WITH UPPER ENDOSCOPY (Abdomen) HERNIA REPAIR HIATAL (Abdomen)     Patient location during evaluation: PACU Anesthesia Type: General Level of consciousness: awake and alert Pain management: pain level controlled Vital Signs Assessment: post-procedure vital signs reviewed and stable Respiratory status: spontaneous breathing, nonlabored ventilation and respiratory function stable Cardiovascular status: blood pressure returned to baseline and stable Postop Assessment: no apparent nausea or vomiting Anesthetic complications: no   No notable events documented.  Last Vitals:  Vitals:   04/12/21 1326 04/12/21 1430  BP: (!) 159/88 (!) 153/81  Pulse: 78 75  Resp: 16 18  Temp: 36.7 C 36.7 C  SpO2: 100% 100%    Last Pain:  Vitals:   04/12/21 1222  TempSrc: Oral  PainSc:                  Cecile Hearing

## 2021-04-12 NOTE — Progress Notes (Signed)
Started water at 1230. Pt still very sleepy.

## 2021-04-12 NOTE — Progress Notes (Signed)
Pt got up to urinate but declined to walk in the hall. She is still unsteady and sleepy.

## 2021-04-12 NOTE — Progress Notes (Signed)

## 2021-04-12 NOTE — Op Note (Signed)
Alexis Carr [id number] 04/23/1966. 04/12/2021  Preoperative diagnosis:  Morbid obesity (BMI 38.5)  OSA on CPAP  Hiatal hernia with GERD  Cervical arthritis  Primary osteoarthritis of both knees  Postoperative  diagnosis:  1. same  Surgical procedure: Laparoscopic Roux-en-Y gastric bypass (ante-colic, ante-gastric); upper endoscopy With sliding hiatal hernia repair, laparoscopic bilateral tap block  Surgeon: Atilano Ina, M.D. FACS  Asst.: Phylliss Blakes MD FACS  Anesthesia: General plus exparel/marcaine mix  Complications: None   EBL: Minimal   Drains: None   Disposition: PACU in good condition   Indications for procedure: 55 y.o. yo female with morbid obesity who has been unsuccessful at sustained weight loss. The patient's comorbidities are listed above. We discussed the risk and benefits of surgery including but not limited to anesthesia risk, bleeding, infection, blood clot formation, anastomotic leak, anastomotic stricture, ulcer formation, death, respiratory complications, intestinal blockage, internal hernia, gallstone formation, vitamin and nutritional deficiencies, injury to surrounding structures, failure to lose weight and mood changes.   Description of procedure: Patient is brought to the operating room and general anesthesia induced. The patient had received preoperative broad-spectrum IV antibiotics and subcutaneous heparin. The abdomen was widely sterilely prepped with Chloraprep and draped. Patient timeout was performed and correct patient and procedure confirmed. Access was obtained with a 12 mm Optiview trocar in the left upper quadrant and pneumoperitoneum established without difficulty. Under direct vision 12 mm trocars were placed laterally in the right upper quadrant, right upper quadrant midclavicular line, and to the left and above the umbilicus for the camera port. A 5 mm trocar was placed laterally in the left upper quadrant.  Exparel/marcaine mix was  infiltrated in bilateral lateral abdominal walls as a TAP block for postoperative pain relief.  The omentum was slightly tethered to the anterior abdominal wall around the umbilicus extending into the lower abdomen and slightly to the right of midline.  There is no bowel being tethered up just omentum.  I took down the omental adhesions from the anterior abdominal wall. The omentum was brought into the upper abdomen and the transverse mesocolon elevated and the ligament of Treitz clearly identified. A 60 cm biliopancreatic limb was then carefully measured from the ligament of Treitz. The small intestine was divided at this point with a single firing of the white load linear stapler. A Penrose drain was sutured to the end of the Roux-en-Y limb for later identification. A 100 cm Roux-en-Y limb was then carefully measured. At this point a side-to-side anastomosis was created between the Roux limb and the end of the biliopancreatic limb. This was accomplished with a single firing of the 60 mm white load linear stapler. The common enterotomy was closed with a running 2-0 Vicryl begun at either end of the enterotomy and tied centrally.  Vistaseal tissue sealant was placed over the anastomosis. The mesenteric defect was then closed with running 2-0 silk. The omentum was then divided with the harmonic scalpel up towards the transverse colon to allow mobility of the Roux limb toward the gastric pouch. The patient was then placed in steep reversed Trendelenburg. Through a 5 mm subxiphoid site the [facility] retractor was placed and the left lobe of the liver elevated with excellent exposure of the upper stomach and hiatus.   On preoperative imaging the patient had evidence of a small sliding hiatal hernia.  There appeared to be weakness at the hiatus.  With the aid of the assistant we incised the gastrohepatic ligament.  The right crus was  identified.  The perigastric fat abutting the right crus was gently incised with  harmonic scalpel.  The posterior esophagus/upper stomach was identified.  We identified the posterior vagus nerve.  Identified the confluence of the left and right crus.  There appeared to be a small sliding hiatal hernia.  I continued mobilizing it slightly posteriorly and along the right crura.  I ended up reapproximating the left and right crura with a single 0 Ethibond Endo Stitch secured with a titanium tie knot.  The angle of Hiss was then mobilized with the harmonic scalpel. A 5-6 cm gastric pouch was then carefully measured along the lesser curve of the stomach. Dissection was carried along the lesser curve at this point with the Harmonic scalpel working carefully back toward the lesser sac at right angles to the lesser curve. The free lesser sac was then entered. After being sure all tubes were removed from the stomach an initial firing of the gold load 60 mm linear stapler was fired at right angles across the lesser curve for about 4 cm. The gastric pouch was further mobilized posteriorly and then the pouch was completed with 3 further firings of the 60 mm blue load linear stapler up through the previously dissected angle of His.  I did need to use 1 white load stapler to transect the last part of the intact upper stomach where the knife had not cut through the previously placed staples.  It was ensured that the pouch was completely mobilized away from the gastric remnant. This created a nice tubular 5 cm gastric pouch. The Roux limb was then brought up in an antecolic fashion with the candycane facing to the patient's left without undue tension. The gastrojejunostomy was created with an initial posterior row of 2-0 Vicryl between the Roux limb and the staple line of the gastric pouch. Enterotomies were then made in the gastric pouch and the Roux limb with the harmonic scalpel and at approximately 2-2-1/2 cm anastomosis was created with a single firing of the 11mm blue load linear stapler. The staple  line was inspected and was intact without bleeding. The common enterotomy was then closed with running 2-0 Vicryl begun at either end and tied centrally. The Ewall tube was then easily passed through the anastomosis and an outer anterior layer of running 2-0 Vicryl was placed. The Ewald tube was removed. With the outlet of the gastrojejunostomy clamped and under saline irrigation the assistant performed upper endoscopy and with the gastric pouch tensely distended with air-there was no evidence of leak on this test. The pouch was desufflated. The Vonita Moss defect was closed with running 2-0 silk. The abdomen was inspected for any evidence of bleeding or bowel injury and everything looked fine. The Nathanson retractor was removed under direct vision after coating the anastomosis with Vistaseal tissue sealant. All CO2 was evacuated and trochars removed. Skin incisions were closed with 4-0 monocryl in a subcuticular fashion followed by steri-strips and bandages. Sponge needle and instrument counts were correct. The patient was taken to the PACU in good condition.    Mary Sella. Andrey Campanile, MD, FACS General, Bariatric, & Minimally Invasive Surgery [facility], [place]

## 2021-04-12 NOTE — Interval H&P Note (Signed)
History and Physical Interval Note:  04/12/2021 7:27 AM  Alexis Carr  has presented today for surgery, with the diagnosis of MORBID OBESITY.  The various methods of treatment have been discussed with the patient and family. After consideration of risks, benefits and other options for treatment, the patient has consented to  Procedure(s): LAPAROSCOPIC ROUX-EN-Y GASTRIC BYPASS WITH UPPER ENDOSCOPY (N/A) HERNIA REPAIR HIATAL (N/A) as a surgical intervention.  The patient's history has been reviewed, patient examined, no change in status, stable for surgery.  I have reviewed the patient's chart and labs.  Questions were answered to the patient's satisfaction.     Gaynelle Adu

## 2021-04-12 NOTE — Anesthesia Procedure Notes (Signed)
Procedure Name: Intubation Date/Time: 04/12/2021 7:40 AM Performed by: Cleda Daub, CRNA Pre-anesthesia Checklist: Patient identified, Emergency Drugs available, Suction available and Patient being monitored Patient Re-evaluated:Patient Re-evaluated prior to induction Oxygen Delivery Method: Circle system utilized Preoxygenation: Pre-oxygenation with 100% oxygen Induction Type: IV induction Ventilation: Mask ventilation without difficulty Laryngoscope Size: Mac and 4 Grade View: Grade II Tube type: Oral Tube size: 7.5 mm Number of attempts: 1 Airway Equipment and Method: Stylet and Oral airway Placement Confirmation: ETT inserted through vocal cords under direct vision, positive ETCO2 and breath sounds checked- equal and bilateral Secured at: 22 cm Tube secured with: Tape Dental Injury: Teeth and Oropharynx as per pre-operative assessment

## 2021-04-12 NOTE — Progress Notes (Signed)
Pt too sleepy to perform IS at this time.

## 2021-04-12 NOTE — Op Note (Signed)
Preoperative diagnosis: Roux-en-Y gastric bypass  Postoperative diagnosis: Same   Procedure: Upper endoscopy   Surgeon: Berna Bue, M.D.  Anesthesia: Gen.   Description of procedure: The endoscope was placed in the mouth and oropharynx and under endoscopic vision it was advanced to the esophagogastric junction which was identified at 37cm from the teeth.  The pouch was tensely insufflated while the upper abdomen was flooded with irrigation to perform a leak test, which was negative. No bubbles were seen.  The staple line was hemostatic and anastomosis is widely patent, measured at 42cm from the teeth. The lumen was decompressed and the scope was withdrawn without difficulty.    Berna Bue, M.D. General, Bariatric, & Minimally Invasive Surgery [facility] [place]

## 2021-04-12 NOTE — H&P (Signed)
DATE OF ENCOUNTER: 03/18/2021 Subjective   Chief Complaint: followup.   History of Present Illness: Alexis Carr is a 55 y.o. female who is seen today for follow-up regarding her class II obesity and related comorbidities. I initially met her in late September 2 evaluate her for bariatric surgery. She has completed the bariatric surgery evaluation process. She has received nutritional psychological evaluation clearance.  Comorbidities include obstructive sleep apnea on CPAP, gastroesophageal reflux disease with a small hiatal hernia, osteoarthritis of multiple joints, occasional migraines   She denies any chest pain, chest pressure, shortness of breath, orthopnea, paroxysmal nocturnal dyspnea, personal or family history of blood clots or peripheral edema. She may occasionally have some dyspnea on exertion. She uses CPAP. She has occasional GERD and it is mainly food related .  She denies any medical changes since I last saw her in September. She denies any trips to the emergency room or hospital. Review of Systems: A complete review of systems was obtained from the patient. I have reviewed this information and discussed as appropriate with the patient. See HPI as well for other ROS.  ROS   Medical History: Past Medical History:  Diagnosis Date   Anemia   Arthritis   Sleep apnea   Patient Active Problem List  Diagnosis   Cervical arthritis   Past Surgical History:  Procedure Laterality Date   CESAREAN SECTION    No Known Allergies  Current Outpatient Medications on File Prior to Visit  Medication Sig Dispense Refill   ascorbic acid, vitamin C, (VITAMIN C) 500 MG tablet Take 500 mg by mouth once daily   ashwagandha root extract 300 mg Cap Take by mouth   cyanocobalamin (VITAMIN B12) 1,000 mcg/mL injection Inject into the muscle monthly   ergocalciferol, vitamin D2, 1,250 mcg (50,000 unit) capsule ergocalciferol (vitamin D2) 1,250 mcg (50,000 unit) capsule   eve  prim-linoleic-gamolen-cran 500-365-45-200 mg Cap Take by mouth   ferrous gluconate (FERGON) 240 (27 FE) MG tablet Take by mouth once daily   flaxseed oiL Oil Use   green tea leaf extract (GREEN TEA) 250 mg Cap Take by mouth   Lactobac no.41/Bifidobact no.7 (PROBIOTIC-10 ORAL) Take by mouth   lisdexamfetamine (VYVANSE) 20 MG capsule Vyvanse 20 mg capsule   magnesium oxide (MAG-OX) 400 mg (241.3 mg magnesium) tablet Take 400 mg by mouth once daily   omega 3-dha-epa-fish oil 1,000 mg (250 mg-750 mg)/5 mL Liqd Take by mouth   TURMERIC, BULK, MISC Use   ubidecarenone/vitamin E mixed (COQ10 SG 100 ORAL) Take by mouth   vitamin D3-vitamin K2 1,250-200 mcg Cap Take by mouth   zinc citrate-phytase (ZYTAZE) 25-500 mg capsule Take by mouth   No current facility-administered medications on file prior to visit.   History reviewed. No pertinent family history.   Social History   Tobacco Use  Smoking Status Never  Smokeless Tobacco Never    Social History   Socioeconomic History   Marital status: Married  Tobacco Use   Smoking status: Never   Smokeless tobacco: Never  Vaping Use   Vaping Use: Never used  Substance and Sexual Activity   Alcohol use: Never   Drug use: Never   Objective:   Vitals:  03/18/21 1533  Pulse: 93  Temp: 36.7 C (98 F)  SpO2: 98%  Weight: (!) 105.7 kg (233 lb)  Height: 166.4 cm (5' 5.5")   Body mass index is 38.18 kg/m.  Gen: alert, NAD, non-toxic appearing Pupils: equal, no scleral icterus Pulm: Lungs clear to  auscultation, symmetric chest rise CV: regular rate and rhythm Abd: soft, nontender, nondistended. No cellulitis. 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 Small hiatal hernia  A1c 5.5  Chest x-ray normal Assessment and Plan:  Diagnoses and all  orders for this visit:  Morbid obesity (CMS-HCC)  OSA on CPAP  Hiatal hernia with GERD  Cervical arthritis  Primary osteoarthritis of both knees   Reviewed her work-up. We rediscussed the finding of a small hiatal hernia. We discussed that we would test for 1 intraoperatively. If found to have 1 we would repair it. We discussed what that would involve. We rediscussed the typical hospitalization as well as typical recovery. We rediscussed the typical issues that we see commonly after surgery. We rediscussed the change in eating techniques and behaviors needed after surgery. I answered her questions about how she would be seen in the office if there is an urgent issue postoperatively. She will have a preoperative education class. She signed the surgical consent form. She had no additional questions regarding the procedure  This patient encounter took 20 minutes today to perform the following: take history, perform exam, review outside records, interpret imaging, counsel the patient on their diagnosis   No follow-ups on file.  Leighton Ruff. Dakotah Orrego MD FACS General, Minimally Invasive, & Bariatric Surgery

## 2021-04-12 NOTE — Transfer of Care (Signed)
Immediate Anesthesia Transfer of Care Note  Patient: Alexis Carr  Procedure(s) Performed: LAPAROSCOPIC ROUX-EN-Y GASTRIC BYPASS WITH UPPER ENDOSCOPY (Abdomen) HERNIA REPAIR HIATAL (Abdomen)  Patient Location: PACU  Anesthesia Type:General  Level of Consciousness: awake, alert , oriented and patient cooperative  Airway & Oxygen Therapy: Patient Spontanous Breathing and Patient connected to face mask oxygen  Post-op Assessment: Report given to RN and Post -op Vital signs reviewed and stable  Post vital signs: Reviewed and stable  Last Vitals:  Vitals Value Taken Time  BP 153/86 04/12/21 0950  Temp 36.4 C 04/12/21 0950  Pulse 79 04/12/21 0955  Resp 16 04/12/21 0955  SpO2 100 % 04/12/21 0955  Vitals shown include unvalidated device data.  Last Pain:  Vitals:   04/12/21 0548  TempSrc: Oral         Complications: No notable events documented.

## 2021-04-12 NOTE — Progress Notes (Signed)
PHARMACY CONSULT FOR:  Risk Assessment for Post-Discharge VTE Following Bariatric Surgery  Post-Discharge VTE Risk Assessment: This patient's probability of 30-day post-discharge VTE is increased due to the factors marked:   Female    Age >/=60 years    BMI >/=50 kg/m2    CHF    Dyspnea at Rest    Paraplegia   X Non-gastric-band surgery    Operation Time >/=3 hr    Return to OR     Length of Stay >/= 3 d   Hx of VTE   Hypercoagulable condition   Significant venous stasis       Predicted probability of 30-day post-discharge VTE: 0.16%  Other patient-specific factors to consider:   Recommendation for Discharge: No pharmacologic prophylaxis post-discharge  Alexis Carr is a 55 y.o. 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  Recent Labs    04/12/21 1019  HGB 11.7*  HCT 35.9*   Estimated Creatinine Clearance: 97.2 mL/min (by C-G formula based on SCr of 0.65 mg/dL).    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   Past Week   Cholecalciferol (VITAMIN D3) 125 MCG (5000 UT) CAPS Take 5,000 Units by mouth daily.   Past Week   Coenzyme Q10 (CO Q-10) 200 MG CAPS Take 200 mg by mouth in the morning and at bedtime.   Past Week   Evening Primrose Oil 1000 MG CAPS Take 1,000 mg by mouth daily.   Past Week   Ferrous Sulfate 27 MG TABS Take 27 mg by mouth daily.   Past Week   Flaxseed, Linseed, (FLAXSEED OIL) 1200 MG CAPS Take  1,200 mg by mouth in the morning and at bedtime.   Past Week   magnesium gluconate (MAGONATE) 500 MG tablet Take 500 mg by mouth in the morning and at bedtime.   Past Week   Omega-3 1000 MG CAPS Take 1,000 mg by mouth daily.   Past Week   OVER THE COUNTER MEDICATION Take 1 capsule by mouth in the morning and at bedtime. Beet Root   Past Week   TURMERIC CURCUMIN PO Take 600 mg by mouth in the morning and at bedtime.   Past Week   vitamin B-12 (CYANOCOBALAMIN) 500 MCG tablet Take 500 mcg by mouth in the morning and at bedtime.   Past Week   vitamin C (ASCORBIC ACID) 250 MG tablet Take 250 mg by mouth 2 (two) times daily.   Past Week   Vitamin D, Ergocalciferol, (DRISDOL) 50000 units CAPS capsule Take 50,000 Units by mouth every 7 (seven) days.   Past Week   VYVANSE 40 MG capsule TAKE 1 CAPSULE BY MOUTH EVERY DAY IN THE MORNING   04/11/2021   cyproheptadine (PERIACTIN) 4 MG tablet Take one-half tablet by mouth at bedtime. (Patient not taking: Reported on 03/31/2021) 15  tablet 5 Not Taking   halobetasol (ULTRAVATE) 0.05 % cream Can apply to insect bites twice daily if needed. (Patient not taking: Reported on 03/31/2021) 50 g 1 Not Taking   montelukast (SINGULAIR) 10 MG tablet Take 1 tablet (10 mg total) by mouth at bedtime. (Patient not taking: Reported on 03/31/2021) 30 tablet 5 Not Taking   Olopatadine HCl 0.2 % SOLN Can use one drop in each eye once daily if needed. (Patient not taking: Reported on 03/31/2021) 2.5 mL 5 Not Taking       Valentina Gu 04/12/2021,12:44 PM

## 2021-04-12 NOTE — Discharge Instructions (Signed)

## 2021-04-12 NOTE — Progress Notes (Signed)
Pt still sleepy. Stated when she next gets up to go the bathroom she will try to walk.

## 2021-04-13 ENCOUNTER — Encounter (HOSPITAL_COMMUNITY): Payer: Self-pay | Admitting: General Surgery

## 2021-04-13 LAB — COMPREHENSIVE METABOLIC PANEL
ALT: 33 U/L (ref 0–44)
AST: 33 U/L (ref 15–41)
Albumin: 3.7 g/dL (ref 3.5–5.0)
Alkaline Phosphatase: 73 U/L (ref 38–126)
Anion gap: 3 — ABNORMAL LOW (ref 5–15)
BUN: 9 mg/dL (ref 6–20)
CO2: 28 mmol/L (ref 22–32)
Calcium: 9.1 mg/dL (ref 8.9–10.3)
Chloride: 104 mmol/L (ref 98–111)
Creatinine, Ser: 0.75 mg/dL (ref 0.44–1.00)
GFR, Estimated: >60 mL/min (ref >60)
Glucose, Bld: 138 mg/dL — ABNORMAL HIGH (ref 70–99)
Potassium: 4.3 mmol/L (ref 3.5–5.1)
Sodium: 135 mmol/L (ref 135–145)
Total Bilirubin: 0.6 mg/dL (ref 0.3–1.2)
Total Protein: 7.1 g/dL (ref 6.5–8.1)

## 2021-04-13 LAB — CBC WITH DIFFERENTIAL/PLATELET
Abs Immature Granulocytes: 0.04 10*3/uL (ref 0.00–0.07)
Basophils Absolute: 0 10*3/uL (ref 0.0–0.1)
Basophils Relative: 0 %
Eosinophils Absolute: 0 10*3/uL (ref 0.0–0.5)
Eosinophils Relative: 0 %
HCT: 34.7 % — ABNORMAL LOW (ref 36.0–46.0)
Hemoglobin: 11.2 g/dL — ABNORMAL LOW (ref 12.0–15.0)
Immature Granulocytes: 0 %
Lymphocytes Relative: 14 %
Lymphs Abs: 1.3 10*3/uL (ref 0.7–4.0)
MCH: 29.9 pg (ref 26.0–34.0)
MCHC: 32.3 g/dL (ref 30.0–36.0)
MCV: 92.8 fL (ref 80.0–100.0)
Monocytes Absolute: 1.2 10*3/uL — ABNORMAL HIGH (ref 0.1–1.0)
Monocytes Relative: 13 %
Neutro Abs: 6.7 10*3/uL (ref 1.7–7.7)
Neutrophils Relative %: 73 %
Platelets: 150 10*3/uL (ref 150–400)
RBC: 3.74 MIL/uL — ABNORMAL LOW (ref 3.87–5.11)
RDW: 12.8 % (ref 11.5–15.5)
WBC: 9.2 10*3/uL (ref 4.0–10.5)
nRBC: 0 % (ref 0.0–0.2)

## 2021-04-13 MED ORDER — TRAMADOL HCL 50 MG PO TABS: 50.0000 mg | ORAL_TABLET | Freq: Four times a day (QID) | ORAL | 0 refills | Status: DC | PRN

## 2021-04-13 MED ORDER — ONDANSETRON 4 MG PO TBDP: 4.0000 mg | ORAL_TABLET | Freq: Four times a day (QID) | ORAL | 0 refills | Status: DC | PRN

## 2021-04-13 MED ORDER — ACETAMINOPHEN 500 MG PO TABS: 1000.0000 mg | ORAL_TABLET | Freq: Three times a day (TID) | ORAL | 0 refills | Status: AC

## 2021-04-13 MED ORDER — PANTOPRAZOLE SODIUM 40 MG PO TBEC: 40.0000 mg | DELAYED_RELEASE_TABLET | Freq: Every day | ORAL | 0 refills | Status: DC

## 2021-04-13 NOTE — Progress Notes (Signed)
24hr fluid recall: 480mL. Per dehydration protocol, will call pt to f/u within one week post op. 

## 2021-04-13 NOTE — Progress Notes (Signed)
Patient alert and oriented, pain is controlled. Patient is tolerating fluids, advanced to protein shake today, patient is tolerating well. Reviewed Gastric Bypass discharge instructions with patient and patient is able to articulate understanding. Provided information on BELT program, Support Group and [facility] outpatient pharmacy. All questions answered, will continue to monitor.    

## 2021-04-13 NOTE — Discharge Summary (Signed)
Physician Discharge Summary  JA PISTOLE MEQ:[id number] DOB: 08-07-1965 DOA: 04/12/2021  PCP: Philemon Kingdom, MD  Admit date: 04/12/2021 Discharge date: 04/13/21  Recommendations for Outpatient Follow-up:     Follow-up Information     Gaynelle Adu, MD. Go on 04/28/2021.   Specialty: General Surgery Why: at 8:30am.  Please arrive 15 minutes prior to your appointment time.  Thank you. Contact information: [address] [place] [address] [phone]         Hedda Slade, PA-C. Go on 05/31/2021.   Specialty: General Surgery Why: at 2:15pm for Dr. Andrey Campanile.  Please arrive 15 minutes prior to your appointment time.  Thank you. Contact information: [address] [place] [address] [phone]                Discharge Diagnoses:  Principal Problem:   S/P gastric bypass Morbid obesity (BMI 38.5)  OSA on CPAP  Hiatal hernia with GERD  Cervical arthritis  Primary osteoarthritis of both knees  Surgical Procedure: Laparoscopic Roux-en-Y gastric bypass with sliding hiatal hernia repair, upper endoscopy  Discharge Condition: Good Disposition: Home  Diet recommendation: Postoperative gastric bypass diet  Filed Weights   04/12/21 0548 04/13/21 0609  Weight: 106.4 kg 106.4 kg     Hospital Course:  The patient was admitted for a planned laparoscopic Roux-en-Y gastric bypass. Please see operative note. Also underwent sliding hiatal hernia repair. Preoperatively the patient was given 5000 units of subcutaneous heparin for DVT prophylaxis. ERAS protocol was used. Postoperative prophylactic Lovenox dosing was started on the evening of postoperative day 0.  The patient was started on ice chips and water on the evening of POD 0 which they tolerated. On postoperative day 1 The patient's diet was advanced to protein shakes which they also tolerated. On POD 1, The patient was ambulating without difficulty. Their vital signs are stable without  fever or tachycardia. Had some minor awareness in LUQ when drinking. Their hemoglobin had remained stable. The patient was maintained on their home settings for CPAP therapy. The patient had received discharge instructions and counseling. They were deemed stable for discharge.  Discussed importance of ambulation Encouraged to pt to hold off on some of her OTC supplements Didn't meet criteria for extended vte prophylaxis after discharge.   BP (!) 146/66 (BP Location: Left Arm)   Pulse 75   Temp 98.4 F (36.9 C) (Oral)   Resp 18   Ht 5' 5.5" (1.664 m)   Wt 106.4 kg   SpO2 100%   BMI 38.44 kg/m   Gen: alert, NAD, non-toxic appearing Pupils: equal, no scleral icterus Pulm: Lungs clear to auscultation, symmetric chest rise CV: regular rate and rhythm Abd: soft, min tender, nondistended. No cellulitis. No incisional hernia Ext: no edema, no calf tenderness Skin: no rash, no jaundice  Discharge Instructions  Discharge Instructions     Ambulate hourly while awake   Complete by: As directed    Call MD for:  difficulty breathing, headache or visual disturbances   Complete by: As directed    Call MD for:  persistant dizziness or light-headedness   Complete by: As directed    Call MD for:  persistant nausea and vomiting   Complete by: As directed    Call MD for:  redness, tenderness, or signs of infection (pain, swelling, redness, odor or green/yellow discharge around incision site)   Complete by: As directed    Call MD for:  severe uncontrolled pain   Complete  by: As directed    Call MD for:  temperature >101 F   Complete by: As directed    Diet bariatric full liquid   Complete by: As directed    Discharge instructions   Complete by: As directed    See bariatric discharge instructions   Incentive spirometry   Complete by: As directed    Perform hourly while awake      Allergies as of 04/13/2021   No Known Allergies      Medication List     STOP taking these  medications    cyproheptadine 4 MG tablet Commonly known as: PERIACTIN   halobetasol 0.05 % cream Commonly known as: ULTRAVATE   montelukast 10 MG tablet Commonly known as: SINGULAIR   Olopatadine HCl 0.2 % Soln       TAKE these medications    acetaminophen 500 MG tablet Commonly known as: TYLENOL Take 2 tablets (1,000 mg total) by mouth every 8 (eight) hours for 5 days.   Ashwagandha 500 MG Caps Take 500 mg by mouth daily.   Co Q-10 200 MG Caps Take 200 mg by mouth in the morning and at bedtime.   Evening Primrose Oil 1000 MG Caps Take 1,000 mg by mouth daily.   Ferrous Sulfate 27 MG Tabs Take 27 mg by mouth daily.   Flaxseed Oil 1200 MG Caps Take 1,200 mg by mouth in the morning and at bedtime.   magnesium gluconate 500 MG tablet Commonly known as: MAGONATE Take 500 mg by mouth in the morning and at bedtime.   Omega-3 1000 MG Caps Take 1,000 mg by mouth daily.   ondansetron 4 MG disintegrating tablet Commonly known as: ZOFRAN-ODT Take 1 tablet (4 mg total) by mouth every 6 (six) hours as needed for nausea or vomiting.   OVER THE COUNTER MEDICATION Take 1 capsule by mouth in the morning and at bedtime. Beet Root   pantoprazole 40 MG tablet Commonly known as: PROTONIX Take 1 tablet (40 mg total) by mouth daily.   traMADol 50 MG tablet Commonly known as: ULTRAM Take 1 tablet (50 mg total) by mouth every 6 (six) hours as needed (pain).   TURMERIC CURCUMIN PO Take 600 mg by mouth in the morning and at bedtime.   vitamin B-12 500 MCG tablet Commonly known as: CYANOCOBALAMIN Take 500 mcg by mouth in the morning and at bedtime.   vitamin C 250 MG tablet Commonly known as: ASCORBIC ACID Take 250 mg by mouth 2 (two) times daily.   Vitamin D (Ergocalciferol) 1.25 MG (50000 UNIT) Caps capsule Commonly known as: DRISDOL Take 50,000 Units by mouth every 7 (seven) days.   Vitamin D3 125 MCG (5000 UT) Caps Take 5,000 Units by mouth daily.   Vyvanse 40 MG  capsule Generic drug: lisdexamfetamine TAKE 1 CAPSULE BY MOUTH EVERY DAY IN THE MORNING        Follow-up Information     Gaynelle Adu, MD. Go on 04/28/2021.   Specialty: General Surgery Why: at 8:30am.  Please arrive 15 minutes prior to your appointment time.  Thank you. Contact information: [address] [place] [address] [phone]         Hedda Slade, PA-C. Go on 05/31/2021.   Specialty: General Surgery Why: at 2:15pm for Dr. Andrey Campanile.  Please arrive 15 minutes prior to your appointment time.  Thank you. Contact information: [address] [place] [address] [phone]  The results of significant diagnostics from this hospitalization (including imaging, microbiology, ancillary and laboratory) are listed below for reference.    Significant Diagnostic Studies: No results found.  Labs: Basic Metabolic Panel: Recent Labs  Lab 04/13/21 0431  NA 135  K 4.3  CL 104  CO2 28  GLUCOSE 138*  BUN 9  CREATININE 0.75  CALCIUM 9.1   Liver Function Tests: Recent Labs  Lab 04/13/21 0431  AST 33  ALT 33  ALKPHOS 73  BILITOT 0.6  PROT 7.1  ALBUMIN 3.7    CBC: Recent Labs  Lab 04/12/21 1019 04/13/21 0431  WBC  --  9.2  NEUTROABS  --  6.7  HGB 11.7* 11.2*  HCT 35.9* 34.7*  MCV  --  92.8  PLT  --  150    CBG: No results for input(s): GLUCAP in the last 168 hours.  Principal Problem:   S/P gastric bypass   Time coordinating discharge: 20 min  Signed:  Atilano Ina, MD [facility], [place] [phone] 04/13/2021, 1:07 PM

## 2021-04-13 NOTE — Plan of Care (Signed)
Instructions were reviewed with patient. All questions were answered. Patient was transported to main entrance by wheelchair. ° °

## 2021-04-15 ENCOUNTER — Ambulatory Visit: Payer: BC Managed Care – PPO

## 2021-04-15 ENCOUNTER — Telehealth (HOSPITAL_COMMUNITY): Payer: Self-pay | Admitting: *Deleted

## 2021-04-15 NOTE — Telephone Encounter (Signed)
1.  Tell me about your pain and pain management? Pt does c/o some soreness, but states that the pain medication helps.  Pt states that she has only had to take one pill per day since being discharged.  2.  Let's talk about fluid intake.  How much total fluid are you taking in? Pt states that she is working to meet goal of 64 oz of fluid today.  Pt has consumed consumed 20oz of fluid today with coffee, milk, and jello.  Pt encouraged to continue to work towards meeting goal.  Pt instructed to assess status and suggestions daily utilizing Hydration Action Plan on discharge folder and to call [facility] if in the "red zone".   3.  How much protein have you taken in the last 2 days? Pt states that she is working to meet goal of goal of 60g of protein today.  Pt his having trouble with the taste of the protein shakes.  Discussed with pt other options to meet protein goal, such as protein water and unflavored powdered protein.   Discussed with pt plans to try and meet protein goal for today. Will f/u.  4.  Have you had nausea?  Tell me about when have experienced nausea and what you did to help? Pt denies current nausea but states that Zofran does help alleviate symptom when it occurs.   5.  Has the frequency or color changed with your urine? Pt states that she is urinating "fine" with no changes in frequency or urgency.     6.  Tell me what your incisions look like? "Incisions look fine". Pt denies a fever, chills.  Pt states incisions are not swollen, open, or draining.  Pt encouraged to call [facility] if incisions change.   7.  Have you been passing gas? BM? Pt states that she has not had a BM.  Pt instructed to take either Miralax or MoM as instructed per "Gastric Bypass/Sleeve Discharge Home Care Instructions".  Pt to call surgeon's office if not able to have BM with medication.   8.  If a problem or question were to arise who would you call?  Do you know contact numbers for [facility], [facility], and [facility]? Pt denies  dehydration symptoms.  Pt can describe s/sx of dehydration.  Pt knows to call [facility] for surgical, [facility] for nutrition, and [facility] for non-urgent questions or concerns.   9.  How has the walking going? Pt states she is walking around and able to be active without difficulty.   10. Are you still using your incentive spirometer?  If so, how often? Pt states that she is doing the I.S. 10x every hour while awake. Pt encouraged to continue to do so until she sees the surgeon.  11.  How are your vitamins and calcium going?  How are you taking them? Pt states that she will start taking supplements tomorrow.  Will f/u.  Reminded patient that the first 30 days post-operatively are important for successful recovery.  Practice good hand hygiene, wearing a mask when appropriate (since optional in most places), and minimizing exposure to people who live outside of the home, especially if they are exhibiting any respiratory, GI, or illness-like symptoms.

## 2021-04-17 ENCOUNTER — Telehealth (HOSPITAL_COMMUNITY): Payer: Self-pay | Admitting: *Deleted

## 2021-04-26 ENCOUNTER — Other Ambulatory Visit: Payer: Self-pay

## 2021-04-26 ENCOUNTER — Encounter: Payer: BC Managed Care – PPO | Attending: General Surgery | Admitting: Skilled Nursing Facility1

## 2021-04-26 DIAGNOSIS — E669 Obesity, unspecified: Secondary | ICD-10-CM | POA: Diagnosis not present

## 2021-04-27 NOTE — Progress Notes (Signed)
2 Week Post-Operative Nutrition Class   Patient was seen on 04/26/2021 for Post-Operative Nutrition education at the [facility].    Surgery date: 04/13/2021 Surgery type: RYGB Start weight at [facility]: 233.3 Weight today: 220.1 pounds Bowel Habits: Every day to every other day no complaints   Body Composition Scale 04/27/2021  Current Body Weight 220.1  Total Body Fat % 43.5  Visceral Fat 14  Fat-Free Mass % 56.4   Total Body Water % 42.7  Muscle-Mass lbs 29.9  BMI 36.3  Body Fat Displacement          Torso  lbs 59.4         Left Leg  lbs 11.8         Right Leg  lbs 11.8         Left Arm  lbs 5.9         Right Arm   lbs 5.9      The following the learning objectives were met by the patient during this course: Identifies Phase 3 (Soft, High Proteins) Dietary Goals and will begin from 2 weeks post-operatively to 2 months post-operatively Identifies appropriate sources of fluids and proteins  Identifies appropriate fat sources and healthy verses unhealthy fat types   States protein recommendations and appropriate sources post-operatively Identifies the need for appropriate texture modifications, mastication, and bite sizes when consuming solids Identifies appropriate fat consumption and sources Identifies appropriate multivitamin and calcium sources post-operatively Describes the need for physical activity post-operatively and will follow MD recommendations States when to call healthcare provider regarding medication questions or post-operative complications   Handouts given during class include: Phase 3A: Soft, High Protein Diet Handout Phase 3 High Protein Meals Healthy Fats   Follow-Up Plan: Patient will follow-up at [facility] in 6 weeks for 2 month post-op nutrition visit for diet advancement per MD.

## 2021-04-28 ENCOUNTER — Ambulatory Visit: Payer: Self-pay | Admitting: General Surgery

## 2021-04-28 DIAGNOSIS — E860 Dehydration: Secondary | ICD-10-CM | POA: Insufficient documentation

## 2021-04-28 MED ORDER — SODIUM CHLORIDE 0.9 % IV BOLUS: 2000.0000 mL | Freq: Once | INTRAVENOUS | Status: AC

## 2021-04-28 MED ORDER — ACETAMINOPHEN 160 MG/5ML PO SOLN: 325.0000 mg | ORAL | Status: AC | PRN

## 2021-04-28 MED ORDER — ACETAMINOPHEN 325 MG PO TABS: 325.0000 mg | ORAL_TABLET | ORAL | Status: AC | PRN

## 2021-04-28 MED ORDER — ACETAMINOPHEN 80 MG RE SUPP: 325.0000 mg | RECTAL | Status: AC | PRN

## 2021-04-29 ENCOUNTER — Ambulatory Visit (INDEPENDENT_AMBULATORY_CARE_PROVIDER_SITE_OTHER): Payer: BC Managed Care – PPO

## 2021-04-29 ENCOUNTER — Other Ambulatory Visit: Payer: Self-pay

## 2021-04-29 VITALS — BP 125/84 | HR 68 | Temp 98.3°F | Resp 18 | Ht 65.0 in | Wt 221.0 lb

## 2021-04-29 DIAGNOSIS — E860 Dehydration: Secondary | ICD-10-CM

## 2021-04-29 DIAGNOSIS — Z9884 Bariatric surgery status: Secondary | ICD-10-CM

## 2021-04-29 MED ORDER — SODIUM CHLORIDE 0.9 % IV SOLN: Freq: Once | INTRAVENOUS | Status: DC

## 2021-04-29 MED ORDER — SODIUM CHLORIDE 0.9 % IV SOLN: Freq: Once | INTRAVENOUS | Status: AC

## 2021-04-29 NOTE — Progress Notes (Signed)
Diagnosis: Dehydration  Provider:  Chilton Greathouse, MD  Procedure: Infusion  IV Type: Peripheral, IV Location: R Antecubital  Normal Saline, Dose:  Infusion Start Time: 1001  Infusion Stop Time: 1242  Post Infusion IV Care: Peripheral IV Discontinued  Discharge: Condition: Good, Destination: Home . AVS provided to patient.   Performed by:  Marguarite Arbour, RN

## 2021-05-02 ENCOUNTER — Telehealth: Payer: Self-pay | Admitting: Skilled Nursing Facility1

## 2021-05-02 NOTE — Telephone Encounter (Signed)
RD called pt to verify fluid intake once starting soft, solid proteins 2 week post-bariatric surgery.   Daily Fluid intake:  Daily Protein intake: Bowel Habits:   Concerns/issues:    LVM 

## 2021-05-17 ENCOUNTER — Ambulatory Visit
Admit: 2021-05-17 | Discharge: 2021-05-17 | Disposition: A | Discharge status: Home / Self Care | Payer: BC Managed Care – PPO | Attending: General Surgery | Admitting: General Surgery

## 2021-05-17 DIAGNOSIS — Z1231 Encounter for screening mammogram for malignant neoplasm of breast: Secondary | ICD-10-CM

## 2021-05-31 ENCOUNTER — Other Ambulatory Visit: Payer: Self-pay | Admitting: Student

## 2021-06-01 ENCOUNTER — Other Ambulatory Visit: Payer: Self-pay

## 2021-06-01 ENCOUNTER — Ambulatory Visit (INDEPENDENT_AMBULATORY_CARE_PROVIDER_SITE_OTHER): Payer: BC Managed Care – PPO

## 2021-06-01 VITALS — BP 135/80 | HR 76 | Temp 98.8°F | Resp 16 | Ht 65.5 in | Wt 213.4 lb

## 2021-06-01 DIAGNOSIS — E860 Dehydration: Secondary | ICD-10-CM | POA: Diagnosis not present

## 2021-06-01 DIAGNOSIS — Z9884 Bariatric surgery status: Secondary | ICD-10-CM

## 2021-06-01 MED ORDER — SODIUM CHLORIDE 0.9 % IV BOLUS
1000.0000 mL | Freq: Once | INTRAVENOUS | Status: AC
  Administered 2021-06-01: 1000 mL via INTRAVENOUS
  Filled 2021-06-01: qty 1000

## 2021-06-01 MED ORDER — ONDANSETRON HCL 4 MG/2ML IJ SOLN: 4.0000 mg | INTRAMUSCULAR | Status: DC | PRN

## 2021-06-01 MED ORDER — THIAMINE HCL 100 MG/ML IJ SOLN
Freq: Once | INTRAVENOUS | Status: AC
  Filled 2021-06-01: qty 1000

## 2021-06-01 MED ORDER — ONDANSETRON 4 MG PO TBDP: 4.0000 mg | ORAL_TABLET | ORAL | Status: DC | PRN

## 2021-06-01 NOTE — Progress Notes (Signed)
Diagnosis: dehydration   Provider:  Chilton Greathouse, MD  Procedure: Infusion  IV Type: Peripheral, IV Location: L Antecubital  Normal Saline, Dose: 1000 ml  Infusion Start Time: 0846  Infusion Stop Time: 0949     Banana Bag, Dose: 1000 ml  Infusion Start Time: 0950  Infusion Stop Time: 1104   Post Infusion IV Care: Peripheral IV Discontinued  Discharge: Condition: Good, Destination: Home . AVS provided to patient.   Performed by:  Autym Siess, Lyman Speller, LPN

## 2021-06-07 ENCOUNTER — Encounter: Payer: BC Managed Care – PPO | Attending: General Surgery | Admitting: Skilled Nursing Facility1

## 2021-06-07 ENCOUNTER — Other Ambulatory Visit: Payer: Self-pay

## 2021-06-07 DIAGNOSIS — E669 Obesity, unspecified: Secondary | ICD-10-CM | POA: Insufficient documentation

## 2021-06-07 NOTE — Progress Notes (Signed)
Bariatric Nutrition Follow-Up Visit Medical Nutrition Therapy    NUTRITION ASSESSMENT    Surgery date: 04/13/2021 Surgery type: RYGB Start weight at [facility]: 233.3 Weight today: 211.1 pounds   Body Composition Scale 04/27/2021 06/07/2021  Current Body Weight 220.1 211.1  Total Body Fat % 43.5 41.9  Visceral Fat 14 13  Fat-Free Mass % 56.4 58   Total Body Water % 42.7 43.5  Muscle-Mass lbs 29.9 30.4  BMI 36.3 34.8  Body Fat Displacement           Torso  lbs 59.4 54.7         Left Leg  lbs 11.8 10.9         Right Leg  lbs 11.8 10.9         Left Arm  lbs 5.9 5.4         Right Arm   lbs 5.9 5.4   Clinical  Medical hx: anemia Medications: Vitamin C, beet root, flaxseed oil, CoQ10, turmeric, B12, vitamin D2, vitamin D3 with K, Omega 3, zinc, probiotic, magnesium, L-theanine, Evning primrose, ashwagandha, iron (ferrous sulfate), bupropion, Vyvanse   Labs: A1C 5.5 Notable signs/symptoms: knee pain Any previous deficiencies? No  Lifestyle & Dietary Hx  Pt states she is having trouble with what foods go well and what foods do not go well.   Pt states he can tolerate: eggs, cheese, cottage cheese, chili  Looks like pt needs her food sot be extremely moist and is struggling with the learning curve. Pt states she is realizing she needs to adapt to her new normal of eating and drinking. And having that new mind set.   Pt states she is tired of always drinking protein drinks.  Pt states the baritastic app really helps her to get the food and drink she need stating it helps her to feel she is in control stating she is trying to stop stress eating from occurring. Pt states she also finds weighing her foods has been really helpful and ikes she can journal in the baritastic app. Pt states he is realizing eating has to be an intentional act.   Pt states she has been stressed with her daughter studying abroad in [place].   Pt states alkaline water has really saved her. Pt states logging is  neccessary for her to eat and drink throughout the day.   Estimated daily fluid intake: 45 oz Estimated daily protein intake: 60 g Supplements: multi and calcium Current average weekly physical activity: ADL's   24-Hr Dietary Recall First Meal: veggie sausage Snack:   Second Meal: yogurt Snack: yogurt  Third Meal: quest thing Snack:  Beverages: decaf coffee, alkaline water, water + flavorings  Post-Op Goals/ Signs/ Symptoms Using straws: no Drinking while eating: no Chewing/swallowing difficulties: no Changes in vision: no Changes to mood/headaches: no Hair loss/changes to skin/nails: no Difficulty focusing/concentrating: no Sweating: no Limb weakness: no Dizziness/lightheadedness: no Palpitations: no  Carbonated/caffeinated beverages: no N/V/D/C/Gas: no Abdominal pain: no Dumping syndrome: no    NUTRITION DIAGNOSIS  Overweight/obesity ([place]-3.3) related to past poor dietary habits and physical inactivity as evidenced by completed bariatric surgery and following dietary guidelines for continued weight loss and healthy nutrition status.     NUTRITION INTERVENTION Nutrition counseling (C-1) and education (E-2) to facilitate bariatric surgery goals, including: Diet advancement to the next phase (phase 4) now including non starchy vegetables The importance of consuming adequate calories as well as certain nutrients daily due to the body's need for essential vitamins, minerals, and fats  The importance of daily physical activity and to reach a goal of at least 150 minutes of moderate to vigorous physical activity weekly (or as directed by their physician) due to benefits such as increased musculature and improved lab values The importance of intuitive eating specifically learning hunger-satiety cues and understanding the importance of learning a new body: The importance of mindful eating to avoid grazing behaviors   Goals: Only really moist foods: instant pot, crockpot, shredded  chicken cooked in broth Chew until applesauce consistently each bite the size of a dime Let the shame and guilt go -Continue to aim for a minimum of 64 fluid ounces 7 days a week with at least 30 ounces being plain water  -Eat non-starchy vegetables 2 times a day 7 days a week  -Start out with soft cooked vegetables today and tomorrow; if tolerated begin to eat raw vegetables or cooked including salads  -Eat your 3 ounces of protein first then start in on your non-starchy vegetables; once you understand how much of your meal leads to satisfaction and not full while still eating 3 ounces of protein and non-starchy vegetables you can eat them in any order   -Continue to aim for 30 minutes of activity at least 5 times a week  -Do NOT cook with/add to your food: alfredo sauce, cheese sauce, barbeque sauce, ketchup, fat back, butter, bacon grease, grease, Crisco, OR SUGAR   Handouts Provided Include  Phase 4  Learning Style & Readiness for Change Teaching method utilized: Visual & Auditory  Demonstrated degree of understanding via: Teach Back  Readiness Level: action Barriers to learning/adherence to lifestyle change: none identified   RD's Notes for Next Visit Assess adherence to pt chosen goals    MONITORING & EVALUATION Dietary intake, weekly physical activity, body weight  Next Steps Patient is to follow-up in 2 months

## 2022-02-03 LAB — COLOGUARD: COLOGUARD: NEGATIVE

## 2022-08-02 ENCOUNTER — Other Ambulatory Visit: Payer: Self-pay | Admitting: Family Medicine

## 2022-08-02 DIAGNOSIS — Z1231 Encounter for screening mammogram for malignant neoplasm of breast: Secondary | ICD-10-CM

## 2022-09-01 ENCOUNTER — Ambulatory Visit
Admission: RE | Admit: 2022-09-01 | Discharge: 2022-09-01 | Disposition: A | Discharge status: Home / Self Care | Payer: BC Managed Care – PPO | Source: Ambulatory Visit | Attending: Family Medicine | Admitting: Family Medicine

## 2022-09-01 DIAGNOSIS — Z1231 Encounter for screening mammogram for malignant neoplasm of breast: Secondary | ICD-10-CM

## 2022-11-09 ENCOUNTER — Other Ambulatory Visit: Payer: Self-pay | Admitting: Obstetrics and Gynecology

## 2022-11-10 ENCOUNTER — Encounter (HOSPITAL_BASED_OUTPATIENT_CLINIC_OR_DEPARTMENT_OTHER): Payer: Self-pay | Admitting: Obstetrics and Gynecology

## 2022-11-15 ENCOUNTER — Encounter (HOSPITAL_BASED_OUTPATIENT_CLINIC_OR_DEPARTMENT_OTHER): Payer: Self-pay | Admitting: Obstetrics and Gynecology

## 2022-11-15 NOTE — Progress Notes (Signed)
Spoke w/ via phone for pre-op interview--- pt Lab needs dos----    cbc           Lab results------ no COVID test -----patient states asymptomatic no test needed Arrive at ------- 0530 on 11-24-2022 NPO after MN NO Solid Food.  Clear liquids from MN until--- 0430 Med rec completed Medications to take morning of surgery ----- none Diabetic medication ----- n/a Patient instructed no nail polish to be worn day of surgery Patient instructed to bring photo id and insurance card day of surgery Patient aware to have Driver (ride ) / caregiver    for 24 hours after surgery -- husband, mark Patient Special Instructions -----  n/a Pre-Op special Instructions -----  n/a Patient verbalized understanding of instructions that were given at this phone interview. Patient denies shortness of breath, chest pain, fever, cough at this phone interview.

## 2022-11-23 NOTE — Anesthesia Preprocedure Evaluation (Addendum)
Anesthesia Evaluation  Patient identified by MRN, date of birth, ID band Patient awake    Reviewed: Allergy & Precautions, NPO status , Patient's Chart, lab work & pertinent test results  History of Anesthesia Complications Negative for: history of anesthetic complications  Airway Mallampati: II  TM Distance: >3 FB Neck ROM: Full    Dental  (+) Teeth Intact, Dental Advisory Given, Partial Upper, Caps   Pulmonary sleep apnea and Continuous Positive Airway Pressure Ventilation    Pulmonary exam normal breath sounds clear to auscultation       Cardiovascular negative cardio ROS Normal cardiovascular exam Rhythm:Regular Rate:Normal     Neuro/Psych  PSYCHIATRIC DISORDERS Anxiety     negative neurological ROS     GI/Hepatic Neg liver ROS, hiatal hernia,,,  Endo/Other  Obesity   Renal/GU negative Renal ROS     Musculoskeletal  (+) Arthritis ,    Abdominal   Peds  (+) ATTENTION DEFICIT DISORDER WITHOUT HYPERACTIVITY Hematology negative hematology ROS (+) Blood dyscrasia, anemia   Anesthesia Other Findings   Reproductive/Obstetrics                             Anesthesia Physical Anesthesia Plan  ASA: 2  Anesthesia Plan: General   Post-op Pain Management: Tylenol PO (pre-op) and Celebrex PO (pre-op)*   Induction: Intravenous  PONV Risk Score and Plan: 4 or greater and Dexamethasone, Ondansetron and Scopolamine patch - Pre-op  Airway Management Planned: LMA  Additional Equipment: None  Intra-op Plan:   Post-operative Plan: Extubation in OR  Informed Consent: I have reviewed the patients History and Physical, chart, labs and discussed the procedure including the risks, benefits and alternatives for the proposed anesthesia with the patient or authorized representative who has indicated his/her understanding and acceptance.     Dental advisory given  Plan Discussed with: CRNA and  Anesthesiologist  Anesthesia Plan Comments:         Anesthesia Quick Evaluation

## 2022-11-24 ENCOUNTER — Other Ambulatory Visit: Payer: Self-pay

## 2022-11-24 ENCOUNTER — Ambulatory Visit (HOSPITAL_BASED_OUTPATIENT_CLINIC_OR_DEPARTMENT_OTHER)
Admission: RE | Admit: 2022-11-24 | Discharge: 2022-11-24 | Disposition: A | Discharge status: Home / Self Care | Payer: BC Managed Care – PPO | Attending: Obstetrics and Gynecology | Admitting: Obstetrics and Gynecology

## 2022-11-24 ENCOUNTER — Encounter (HOSPITAL_BASED_OUTPATIENT_CLINIC_OR_DEPARTMENT_OTHER): Payer: Self-pay | Admitting: Obstetrics and Gynecology

## 2022-11-24 ENCOUNTER — Encounter (HOSPITAL_BASED_OUTPATIENT_CLINIC_OR_DEPARTMENT_OTHER)
Admission: RE | Disposition: A | Discharge status: Home / Self Care | Payer: Self-pay | Source: Home / Self Care | Attending: Obstetrics and Gynecology

## 2022-11-24 ENCOUNTER — Ambulatory Visit (HOSPITAL_BASED_OUTPATIENT_CLINIC_OR_DEPARTMENT_OTHER): Payer: BC Managed Care – PPO | Admitting: Anesthesiology

## 2022-11-24 DIAGNOSIS — G4733 Obstructive sleep apnea (adult) (pediatric): Secondary | ICD-10-CM | POA: Insufficient documentation

## 2022-11-24 DIAGNOSIS — D250 Submucous leiomyoma of uterus: Secondary | ICD-10-CM | POA: Insufficient documentation

## 2022-11-24 DIAGNOSIS — Z79899 Other long term (current) drug therapy: Secondary | ICD-10-CM | POA: Insufficient documentation

## 2022-11-24 DIAGNOSIS — F909 Attention-deficit hyperactivity disorder, unspecified type: Secondary | ICD-10-CM | POA: Insufficient documentation

## 2022-11-24 DIAGNOSIS — M1990 Unspecified osteoarthritis, unspecified site: Secondary | ICD-10-CM | POA: Insufficient documentation

## 2022-11-24 DIAGNOSIS — E669 Obesity, unspecified: Secondary | ICD-10-CM | POA: Insufficient documentation

## 2022-11-24 DIAGNOSIS — Z6834 Body mass index (BMI) 34.0-34.9, adult: Secondary | ICD-10-CM | POA: Diagnosis not present

## 2022-11-24 DIAGNOSIS — Z9884 Bariatric surgery status: Secondary | ICD-10-CM | POA: Diagnosis not present

## 2022-11-24 DIAGNOSIS — F411 Generalized anxiety disorder: Secondary | ICD-10-CM | POA: Diagnosis not present

## 2022-11-24 DIAGNOSIS — N950 Postmenopausal bleeding: Secondary | ICD-10-CM | POA: Diagnosis present

## 2022-11-24 DIAGNOSIS — Z01818 Encounter for other preprocedural examination: Secondary | ICD-10-CM

## 2022-11-24 HISTORY — DX: Personal history of other specified conditions: Z87.898

## 2022-11-24 HISTORY — PX: DILATATION & CURETTAGE/HYSTEROSCOPY WITH MYOSURE: SHX6511

## 2022-11-24 HISTORY — DX: Attention-deficit hyperactivity disorder, unspecified type: F90.9

## 2022-11-24 HISTORY — DX: Vitamin D deficiency, unspecified: E55.9

## 2022-11-24 HISTORY — DX: Iron deficiency anemia, unspecified: D50.9

## 2022-11-24 HISTORY — DX: Unspecified osteoarthritis, unspecified site: M19.90

## 2022-11-24 HISTORY — DX: Presence of spectacles and contact lenses: Z97.3

## 2022-11-24 HISTORY — DX: Deficiency of other specified B group vitamins: E53.8

## 2022-11-24 HISTORY — DX: Other intervertebral disc degeneration, lumbar region without mention of lumbar back pain or lower extremity pain: M51.369

## 2022-11-24 HISTORY — DX: Postmenopausal bleeding: N95.0

## 2022-11-24 HISTORY — DX: Generalized anxiety disorder: F41.1

## 2022-11-24 HISTORY — DX: Obstructive sleep apnea (adult) (pediatric): G47.33

## 2022-11-24 HISTORY — DX: Other intervertebral disc degeneration, lumbar region: M51.36

## 2022-11-24 HISTORY — DX: Allergic rhinitis, unspecified: J30.9

## 2022-11-24 HISTORY — DX: Personal history of other mental and behavioral disorders: Z86.59

## 2022-11-24 LAB — CBC
HCT: 36.5 % (ref 36.0–46.0)
Hemoglobin: 11.6 g/dL — ABNORMAL LOW (ref 12.0–15.0)
MCH: 29.7 pg (ref 26.0–34.0)
MCHC: 31.8 g/dL (ref 30.0–36.0)
MCV: 93.4 fL (ref 80.0–100.0)
Platelets: 153 10*3/uL (ref 150–400)
RBC: 3.91 MIL/uL (ref 3.87–5.11)
RDW: 12.4 % (ref 11.5–15.5)
WBC: 5.2 10*3/uL (ref 4.0–10.5)
nRBC: 0 % (ref 0.0–0.2)

## 2022-11-24 SURGERY — DILATATION & CURETTAGE/HYSTEROSCOPY WITH MYOSURE
Anesthesia: General | Site: Uterus

## 2022-11-24 MED ORDER — LIDOCAINE 2% (20 MG/ML) 5 ML SYRINGE
INTRAMUSCULAR | Status: DC | PRN
  Administered 2022-11-24: 80 mg via INTRAVENOUS

## 2022-11-24 MED ORDER — FENTANYL CITRATE (PF) 100 MCG/2ML IJ SOLN
INTRAMUSCULAR | Status: AC
  Filled 2022-11-24: qty 2

## 2022-11-24 MED ORDER — SODIUM CHLORIDE 0.9 % IR SOLN
Status: DC | PRN
  Administered 2022-11-24 (×3): 3000 mL

## 2022-11-24 MED ORDER — LIDOCAINE HCL (PF) 2 % IJ SOLN
INTRAMUSCULAR | Status: AC
  Filled 2022-11-24: qty 5

## 2022-11-24 MED ORDER — MIDAZOLAM HCL 2 MG/2ML IJ SOLN
INTRAMUSCULAR | Status: AC
  Filled 2022-11-24: qty 2

## 2022-11-24 MED ORDER — IBUPROFEN 800 MG PO TABS: 800.0000 mg | ORAL_TABLET | Freq: Three times a day (TID) | ORAL | 0 refills | Status: AC | PRN

## 2022-11-24 MED ORDER — DEXMEDETOMIDINE HCL IN NACL 80 MCG/20ML IV SOLN
INTRAVENOUS | Status: DC | PRN
  Administered 2022-11-24 (×2): 4 ug via INTRAVENOUS

## 2022-11-24 MED ORDER — MIDAZOLAM HCL 5 MG/5ML IJ SOLN
INTRAMUSCULAR | Status: DC | PRN
  Administered 2022-11-24: 2 mg via INTRAVENOUS

## 2022-11-24 MED ORDER — KETOROLAC TROMETHAMINE 30 MG/ML IJ SOLN
INTRAMUSCULAR | Status: DC | PRN
  Administered 2022-11-24: 30 mg via INTRAVENOUS

## 2022-11-24 MED ORDER — DEXAMETHASONE SODIUM PHOSPHATE 10 MG/ML IJ SOLN
INTRAMUSCULAR | Status: AC
  Filled 2022-11-24: qty 1

## 2022-11-24 MED ORDER — PROPOFOL 10 MG/ML IV BOLUS
INTRAVENOUS | Status: DC | PRN
  Administered 2022-11-24: 180 mg via INTRAVENOUS

## 2022-11-24 MED ORDER — KETOROLAC TROMETHAMINE 30 MG/ML IJ SOLN
INTRAMUSCULAR | Status: AC
  Filled 2022-11-24: qty 1

## 2022-11-24 MED ORDER — POVIDONE-IODINE 10 % EX SWAB: 2.0000 | Freq: Once | CUTANEOUS | Status: DC

## 2022-11-24 MED ORDER — DEXAMETHASONE SODIUM PHOSPHATE 10 MG/ML IJ SOLN
INTRAMUSCULAR | Status: DC | PRN
  Administered 2022-11-24: 10 mg via INTRAVENOUS

## 2022-11-24 MED ORDER — FENTANYL CITRATE (PF) 100 MCG/2ML IJ SOLN
INTRAMUSCULAR | Status: DC | PRN
  Administered 2022-11-24: 25 ug via INTRAVENOUS
  Administered 2022-11-24: 50 ug via INTRAVENOUS
  Administered 2022-11-24: 25 ug via INTRAVENOUS

## 2022-11-24 MED ORDER — ONDANSETRON HCL 4 MG/2ML IJ SOLN
INTRAMUSCULAR | Status: AC
  Filled 2022-11-24: qty 2

## 2022-11-24 MED ORDER — PROPOFOL 10 MG/ML IV BOLUS
INTRAVENOUS | Status: AC
  Filled 2022-11-24: qty 20

## 2022-11-24 MED ORDER — LACTATED RINGERS IV SOLN: INTRAVENOUS | Status: DC

## 2022-11-24 MED ORDER — ONDANSETRON HCL 4 MG/2ML IJ SOLN
INTRAMUSCULAR | Status: DC | PRN
  Administered 2022-11-24: 4 mg via INTRAVENOUS

## 2022-11-24 SURGICAL SUPPLY — 22 items
CATH ROBINSON RED A/P 16FR (CATHETERS) IMPLANT
DEVICE MYOSURE LITE (MISCELLANEOUS) IMPLANT
DEVICE MYOSURE REACH (MISCELLANEOUS) IMPLANT
DILATOR CANAL MILEX (MISCELLANEOUS) IMPLANT
DRSG TELFA 3X8 NADH STRL (GAUZE/BANDAGES/DRESSINGS) ×2 IMPLANT
GAUZE 4X4 16PLY ~~LOC~~+RFID DBL (SPONGE) ×2 IMPLANT
GLOVE BIOGEL PI IND STRL 7.0 (GLOVE) ×2 IMPLANT
GLOVE ECLIPSE 6.5 STRL STRAW (GLOVE) ×2 IMPLANT
GOWN STRL REUS W/TWL LRG LVL3 (GOWN DISPOSABLE) ×2 IMPLANT
IV NS IRRIG 3000ML ARTHROMATIC (IV SOLUTION) ×2 IMPLANT
KIT PROCEDURE FLUENT (KITS) ×2 IMPLANT
KIT TURNOVER CYSTO (KITS) ×2 IMPLANT
MYOSURE XL FIBROID (MISCELLANEOUS) ×1
PACK VAGINAL MINOR WOMEN LF (CUSTOM PROCEDURE TRAY) ×2 IMPLANT
PAD OB MATERNITY 4.3X12.25 (PERSONAL CARE ITEMS) ×2 IMPLANT
PAD PREP 24X48 CUFFED NSTRL (MISCELLANEOUS) ×2 IMPLANT
SEAL CERVICAL OMNI LOK (ABLATOR) IMPLANT
SEAL ROD LENS SCOPE MYOSURE (ABLATOR) ×2 IMPLANT
SLEEVE SCD COMPRESS KNEE MED (STOCKING) ×2 IMPLANT
SYSTEM TISS REMOVAL MYOSURE XL (MISCELLANEOUS) IMPLANT
TOWEL OR 17X24 6PK STRL BLUE (TOWEL DISPOSABLE) ×2 IMPLANT
WATER STERILE IRR 500ML POUR (IV SOLUTION) ×2 IMPLANT

## 2022-11-24 NOTE — Op Note (Signed)
NAME: POTA, HANIS MEDICAL RECORD NO: [id number] ACCOUNT NO: [id number] DATE OF BIRTH: 1966/04/10 FACILITY: [facility] LOCATION: [facility] PHYSICIAN: Angelo Prindle A. Cherly Hensen, MD  Operative Report   DATE OF PROCEDURE: 11/24/2022  PREOPERATIVE DIAGNOSES:  Postmenopausal bleeding, submucosal fibroid.  PROCEDURE:  Diagnostic hysteroscopy, hysteroscopic resection of submucosal fibroid using MyoSure, dilation and curettage.  POSTOPERATIVE DIAGNOSES:  Postmenopausal bleeding, submucosal fibroid.  ANESTHESIA:  General.  SURGEON:  Cerissa Zeiger A. Cherly Hensen, MD  ASSISTANT:  None.  DESCRIPTION OF PROCEDURE:  Under adequate general anesthesia, the patient was placed in the dorsal lithotomy position.  She was sterilely prepped and draped in the usual fashion.  The patient had voided prior to entering the room; therefore, she was not  catheterized.  A bivalve speculum was placed in the vagina.  The vagina was noted to be very atrophic.  Anterior lip of the cervix was grasped with a single-tooth tenaculum.  The cervix was then serially dilated up to #19 [facility] dilator.  Diagnostic  hysteroscope was introduced into the uterine cavity.  The endometrial cavity was encompassed by this submucosal fibroid. With a left tubal ostia being seen, the right was seen actually going around the fibroid.  It was then noted that this fibroid was  coming off of the right anterior lateral wall of the patient's uterus.  At that point, the hysteroscope was removed.  The cervix was further dilated up to #21 [facility] dilator.  The resectoscope for Reach XL was then introduced and the Reach XL was then  used to resect the fibroid down to its base.  The remaining endometrial cavity was also resected.  Visualization of both tubal ostia without any blockage was noted.  Endocervical canal was without any lesions.  All instruments were then removed from the  vagina.  SPECIMEN:  Labeled endometrial curettings with fibroid resection was sent  to pathology.  FLUID DEFICIT:  650 ml .  ESTIMATED BLOOD LOSS: 5 mL.  COMPLICATIONS:  None.  DISPOSITION:  The patient tolerated the procedure well, was transferred to recovery room in stable condition.      PAA D: 11/24/2022 8:52:08 am T: 11/24/2022 9:18:00 am  JOB: [id number]/ [id number]

## 2022-11-24 NOTE — Transfer of Care (Signed)
Immediate Anesthesia Transfer of Care Note  Patient: Alexis Carr  Procedure(s) Performed: DILATATION & CURETTAGE/HYSTEROSCOPY WITH MYOSURE (Uterus)  Patient Location: PACU  Anesthesia Type:General  Level of Consciousness: awake, alert , and oriented  Airway & Oxygen Therapy: Patient Spontanous Breathing and Patient connected to nasal cannula oxygen  Post-op Assessment: Report given to RN and Post -op Vital signs reviewed and stable  Post vital signs: Reviewed and stable  Last Vitals:  Vitals Value Taken Time  BP 122/75 11/24/22 0845  Temp    Pulse 63 11/24/22 0847  Resp 17 11/24/22 0847  SpO2 100 % 11/24/22 0847  Vitals shown include unfiled device data.  Last Pain:  Vitals:   11/24/22 0603  TempSrc: Oral  PainSc: 0-No pain      Patients Stated Pain Goal: 4 (11/24/22 0603)  Complications: No notable events documented.

## 2022-11-24 NOTE — Brief Op Note (Signed)
11/24/2022  8:49 AM  PATIENT:  Alexis Carr  57 y.o. female  PRE-OPERATIVE DIAGNOSIS:  post menopausal bleeding, submucosal fibroid  POST-OPERATIVE DIAGNOSIS:  post menopausal bleeding, submucosal fibroid.   PROCEDURE:  diagnostic hysteroscopy, hysteroscopic resection of SM fibroid using myosure, dilation and curettage  SURGEON:  Surgeons and Role:    * Maxie Better, MD - Primary  PHYSICIAN ASSISTANT:   ASSISTANTS: none   ANESTHESIA:   general FINDINGS  EBL:  5ml   BLOOD ADMINISTERED:none  DRAINS: none   LOCAL MEDICATIONS USED:  NONE  SPECIMEN:  Source of Specimen:  emc with fibroid resection  DISPOSITION OF SPECIMEN:  PATHOLOGY  COUNTS:  YES  TOURNIQUET:  * No tourniquets in log *  DICTATION: .Other Dictation: Dictation Number [id number]  PLAN OF CARE: Discharge to home after PACU  PATIENT DISPOSITION:  PACU - hemodynamically stable.   Delay start of Pharmacological VTE agent (>24hrs) due to surgical blood loss or risk of bleeding: no

## 2022-11-24 NOTE — H&P (Signed)
Alexis Carr is an 57 y.o. female. G5P3 MBF presents for surgical management of PMB caused by SM fibroid noted on sonogram.  Pt is scheduled for dx hysteroscopy, hysteroscopic resection of fibroid, D&C  Pertinent Gynecological History: Menses: post-menopausal Bleeding: post menopausal bleeding Contraception: none DES exposure: denies Blood transfusions: none Sexually transmitted diseases: no past history Previous GYN Procedures:  c/s   Last mammogram: normal Date: 2024 Last pap: normal Date: 2024 OB History: G5, P3   Menstrual History: Menarche age: n/Alexis No LMP recorded. Patient is postmenopausal.    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   Vitamin B12 deficiency    Vitamin D deficiency    Wears glasses     Past Surgical History:  Procedure Laterality Date   BREAST REDUCTION SURGERY Bilateral 01/22/2000   @[facility]  by dr Aurelio Jew   CESAREAN SECTION     04-02-2001  and 02-18-2003  both @[facility]    FOOT SURGERY Right 2004   bunionectomy right breat toe  (has hardware)   GASTRIC ROUX-EN-Y N/Alexis 04/12/2021   Procedure: LAPAROSCOPIC ROUX-EN-Y GASTRIC BYPASS WITH UPPER ENDOSCOPY;  Surgeon: Gaynelle Adu, MD;  Location: [facility];  Service: General;  Laterality: N/Alexis;   HIATAL HERNIA REPAIR N/Alexis 04/12/2021   Procedure: HERNIA REPAIR HIATAL;  Surgeon: Gaynelle Adu, MD;  Location: [facility];   Service: General;  Laterality: N/Alexis;   HYSTEROSCOPY WITH RESECTOSCOPE  2000   removal fibroid   WRIST GANGLION EXCISION Bilateral    2004;  2005    Family History  Problem Relation Age of Onset   Coronary artery disease Other        GM   Hypertension Other        GM,GF   Stroke Other        GM   Throat cancer Other        GF   Diabetes Neg Hx    Colon cancer Neg Hx    Breast cancer Neg Hx     Social History:  reports that she has never smoked. She has never used smokeless tobacco. She reports that she does not drink alcohol and does not use drugs.  Allergies: No Known Allergies  Medications Prior to Admission  Medication Sig Dispense Refill Last Dose   amphetamine-dextroamphetamine (ADDERALL) 30 MG tablet Take 30 mg by mouth daily.   11/22/2022   ascorbic acid (VITAMIN C) 500 MG tablet Take 500 mg by mouth daily.   11/17/2022   Ashwagandha 500 MG CAPS Take 500 mg by mouth at bedtime.   11/17/2022   Cholecalciferol (VITAMIN D3) 125 MCG (5000 UT) CAPS Take 5,000 Units by mouth daily.   11/17/2022   Coenzyme Q10 (CO Q-10) 200 MG CAPS Take 200 mg  by mouth in the morning and at bedtime.   11/17/2022   doxycycline (ADOXA) 100 MG tablet Take 100 mg by mouth at bedtime.   11/17/2022   Evening Primrose Oil 1000 MG CAPS Take 1,000 mg by mouth daily.   11/17/2022   Ferrous Gluconate (IRON) 240 (27 Fe) MG TABS Take 1 tablet by mouth daily.   11/17/2022   finasteride (PROSCAR) 5 MG tablet Take 2.5 mg by mouth daily with supper.   11/22/2022   hydrOXYzine (ATARAX) 10 MG tablet Take 10 mg by mouth 3 (three) times daily as needed for anxiety.   Past Week   magnesium oxide (MAG-OX) 400 (240 Mg) MG tablet Take 400 mg by mouth daily.   11/17/2022   mometasone (ELOCON) 0.1 % cream Apply 1 Application topically 2 (two) times daily.   Past Month   Turmeric Extra Strength CAPS Take 1,500 mg by mouth daily.   11/17/2022   vitamin B-12 (CYANOCOBALAMIN) 500 MCG tablet Take 500 mcg by mouth in the morning and at bedtime.    11/17/2022   zinc gluconate 50 MG tablet Take 50 mg by mouth daily.   11/17/2022    Review of Systems  All other systems reviewed and are negative.   Blood pressure 124/81, pulse 72, temperature 97.9 F (36.6 C), temperature source Oral, resp. rate 16, height 5\' 5"  (1.651 m), weight 94.8 kg, SpO2 99%. Physical Exam Constitutional:      Appearance: Normal appearance. She is obese.  Eyes:     Extraocular Movements: Extraocular movements intact.  Cardiovascular:     Rate and Rhythm: Regular rhythm.     Heart sounds: Normal heart sounds.  Pulmonary:     Breath sounds: Normal breath sounds.  Abdominal:     Palpations: Abdomen is soft.  Genitourinary:    General: Normal vulva.     Comments: Vagina; no lesion  Cervix parous Uterus AV irregular  No adnexal mass Musculoskeletal:        General: Normal range of motion.     Cervical back: Neck supple.  Skin:    General: Skin is warm and dry.  Neurological:     General: No focal deficit present.     Mental Status: She is alert and oriented to person, place, and time.  Psychiatric:        Mood and Affect: Mood normal.        Behavior: Behavior normal.     Results for orders placed or performed during the hospital encounter of 11/24/22 (from the past 24 hour(s))  CBC     Status: Abnormal   Collection Time: 11/24/22  6:24 AM  Result Value Ref Range   WBC 5.2 4.0 - 10.5 K/uL   RBC 3.91 3.87 - 5.11 MIL/uL   Hemoglobin 11.6 (L) 12.0 - 15.0 g/dL   HCT 95.2 84.1 - 32.4 %   MCV 93.4 80.0 - 100.0 fL   MCH 29.7 26.0 - 34.0 pg   MCHC 31.8 30.0 - 36.0 g/dL   RDW 40.1 02.7 - 25.3 %   Platelets 153 150 - 400 K/uL   nRBC 0.0 0.0 - 0.2 %    No results found.  Assessment/Plan: PMB SM fibroids P) dx hysteroscopy, hysteroscopic resection of SM fibroid using Myosure, D&C. Procedure explained. Risk of surgery reviewed including infection, bleeding, injury to surrounding organ structures, thermal injury, fluid overload and its mgmt, uterine  perforation( 05/998) and its risk. All ? answered  Alexis Carr Alexis Carr 11/24/2022, 7:32 AM

## 2022-11-24 NOTE — Discharge Instructions (Addendum)
  Post Anesthesia Home Care Instructions  Activity: Get plenty of rest for the remainder of the day. A responsible individual must stay with you for 24 hours following the procedure.  For the next 24 hours, DO NOT: -Drive a car -Advertising copywriter -Drink alcoholic beverages -Take any medication unless instructed by your physician -Make any legal decisions or sign important papers.  Meals: Start with liquid foods such as gelatin or soup. Progress to regular foods as tolerated. Avoid greasy, spicy, heavy foods. If nausea and/or vomiting occur, drink only clear liquids until the nausea and/or vomiting subsides. Call your physician if vomiting continues.  Special Instructions/Symptoms: Your throat may feel dry or sore from the anesthesia or the breathing tube placed in your throat during surgery. If this causes discomfort, gargle with warm salt water. The discomfort should disappear within 24 hours.     You received a nonsteroidal medication (Toradol) at approximately 8 am; next available dose of the same class medication , such as Ibuprofen, Naproxen; may be taken after 2 pm  CALL  IF TEMP>100.4, NOTHING PER VAGINA X 2 WK, CALL IF SOAKING A MAXI  PAD EVERY HOUR OR MORE FREQUENTLY

## 2022-11-24 NOTE — Anesthesia Procedure Notes (Signed)
Procedure Name: LMA Insertion Date/Time: 11/24/2022 7:48 AM  Performed by: Marlea Gambill D, CRNAPre-anesthesia Checklist: Patient identified, Emergency Drugs available, Suction available and Patient being monitored Patient Re-evaluated:Patient Re-evaluated prior to induction Oxygen Delivery Method: Circle system utilized Preoxygenation: Pre-oxygenation with 100% oxygen Induction Type: IV induction Ventilation: Mask ventilation without difficulty LMA: LMA inserted LMA Size: 4.0 Tube type: Oral Number of attempts: 1 Placement Confirmation: positive ETCO2 and breath sounds checked- equal and bilateral Tube secured with: Tape Dental Injury: Teeth and Oropharynx as per pre-operative assessment

## 2022-11-24 NOTE — Anesthesia Postprocedure Evaluation (Signed)
Anesthesia Post Note  Patient: ROKISHA OBRION  Procedure(s) Performed: DILATATION & CURETTAGE/HYSTEROSCOPY WITH MYOSURE (Uterus)     Patient location during evaluation: PACU Anesthesia Type: General Level of consciousness: awake and alert Pain management: pain level controlled Vital Signs Assessment: post-procedure vital signs reviewed and stable Respiratory status: spontaneous breathing, nonlabored ventilation, respiratory function stable and patient connected to nasal cannula oxygen Cardiovascular status: blood pressure returned to baseline and stable Postop Assessment: no apparent nausea or vomiting Anesthetic complications: no   No notable events documented.  Last Vitals:  Vitals:   11/24/22 0900 11/24/22 0915  BP: (!) 121/92 134/77  Pulse: 77 60  Resp: 17 13  Temp:  36.5 C  SpO2: 100% 100%    Last Pain:  Vitals:   11/24/22 0915  TempSrc:   PainSc: 0-No pain                 Jamaurie Bernier

## 2022-11-27 ENCOUNTER — Encounter (HOSPITAL_BASED_OUTPATIENT_CLINIC_OR_DEPARTMENT_OTHER): Payer: Self-pay | Admitting: Obstetrics and Gynecology

## 2022-11-27 LAB — SURGICAL PATHOLOGY

## 2022-11-28 ENCOUNTER — Encounter (HOSPITAL_COMMUNITY): Payer: Self-pay | Admitting: *Deleted

## 2023-05-20 IMAGING — MG MM DIGITAL SCREENING BILAT W/ TOMO AND CAD
8 series · 8 of 24 positions shown · non-contrast
Comparison: Previous exam(s).

CLINICAL DATA: Screening.

EXAM:
DIGITAL SCREENING BILATERAL MAMMOGRAM WITH TOMOSYNTHESIS AND CAD
TECHNIQUE: Bilateral screening digital craniocaudal and mediolateral oblique
mammograms were obtained. Bilateral screening digital breast
tomosynthesis was performed. The images were evaluated with
computer-aided detection.

[R CC synth-2D]
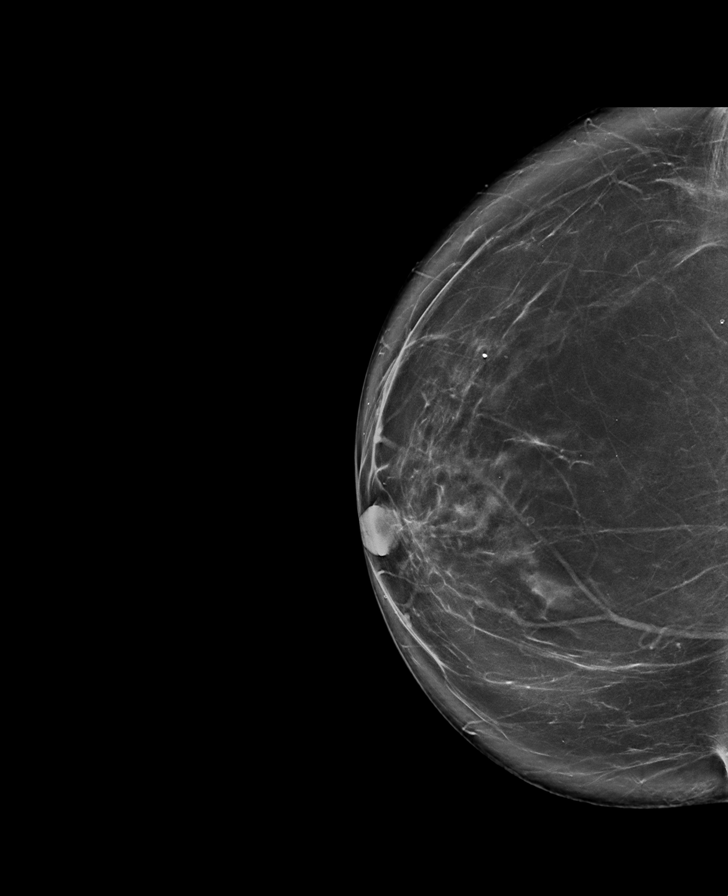

[R MLO synth-2D]
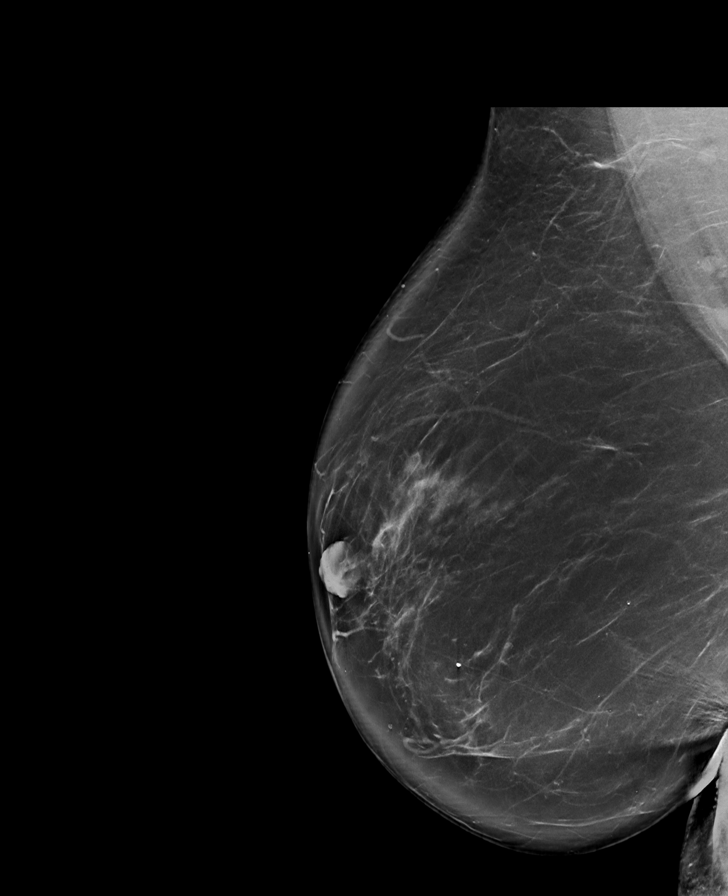

[L MLO synth-2D]
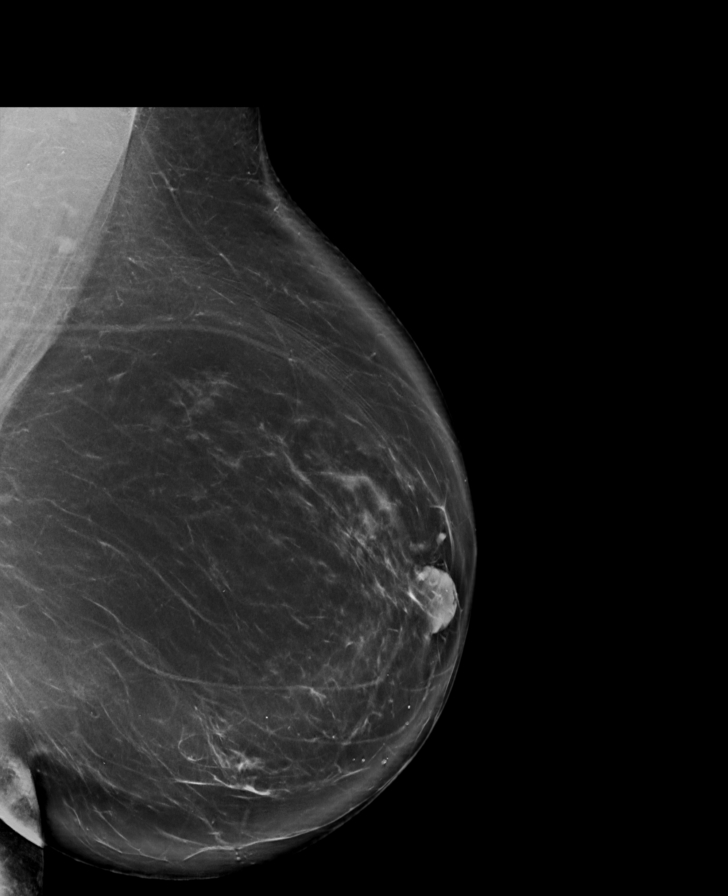

[L CC synth-2D]
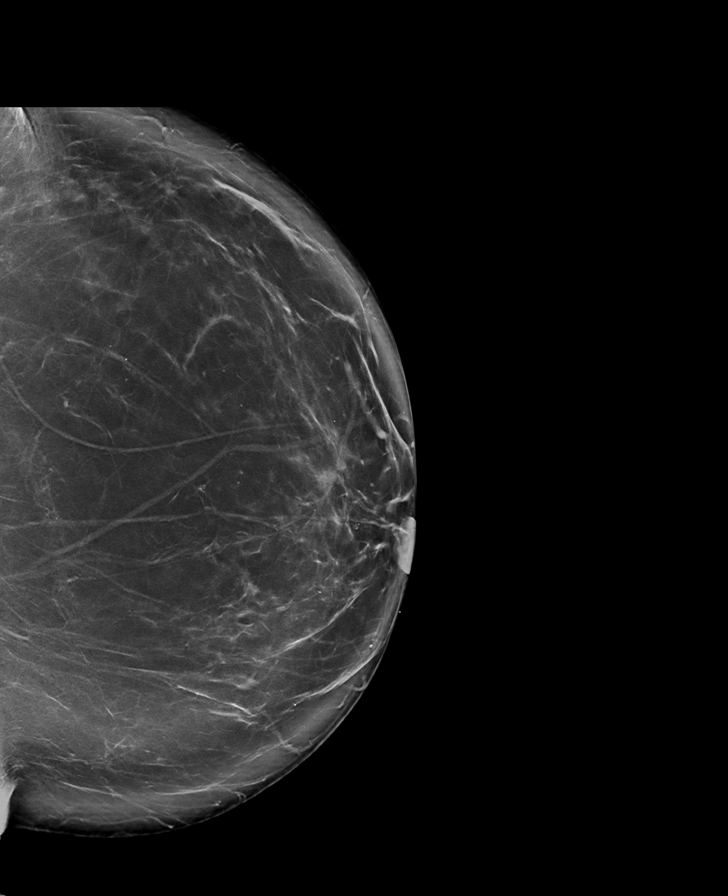

[L CC tomo · tomo slice 54/107.0]
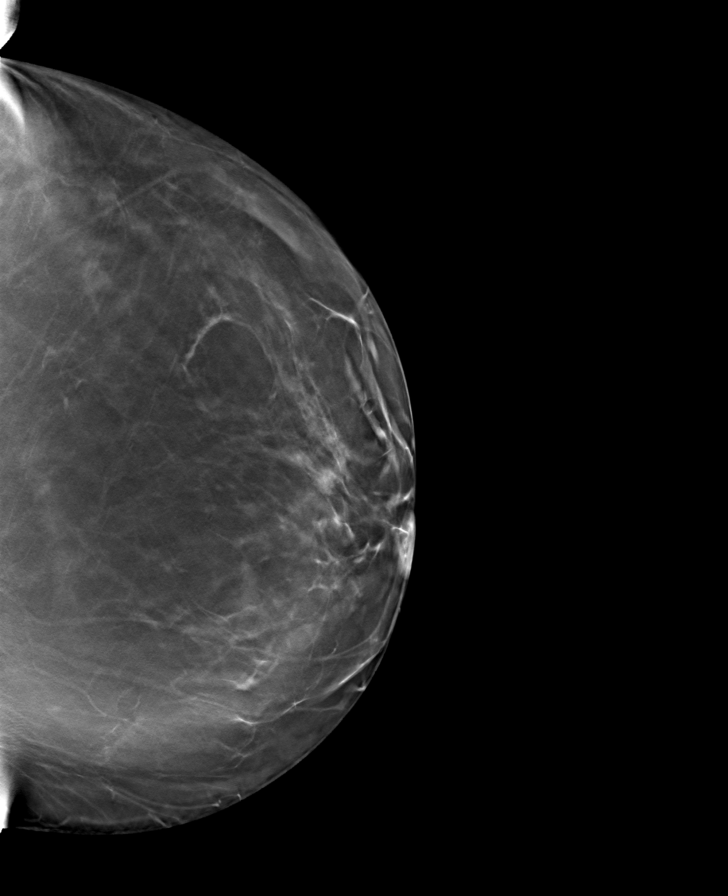

[R MLO tomo · tomo slice 53/106.0]
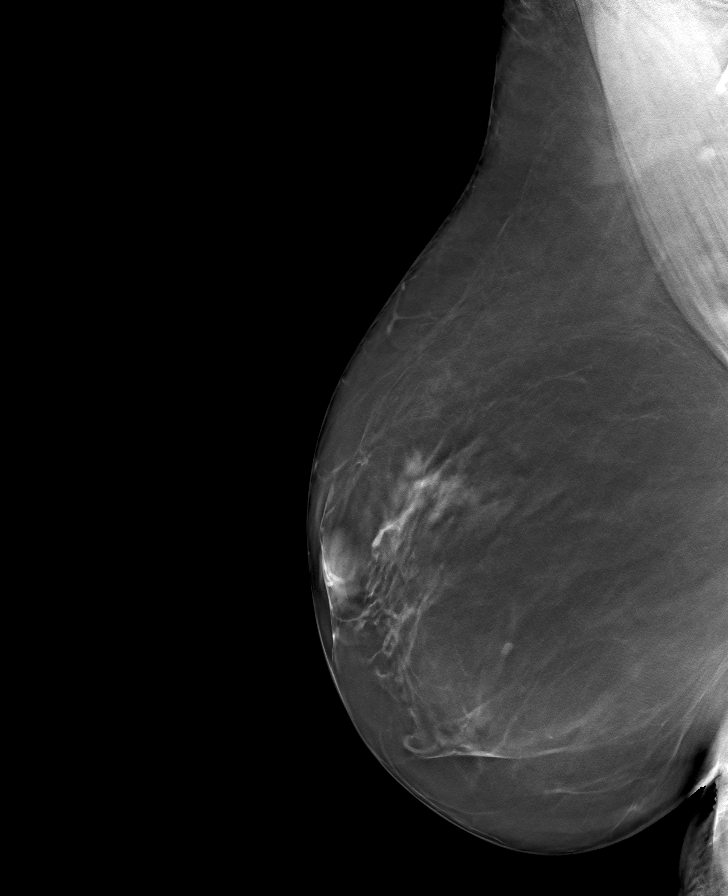

[L MLO tomo · tomo slice 57/112.0]
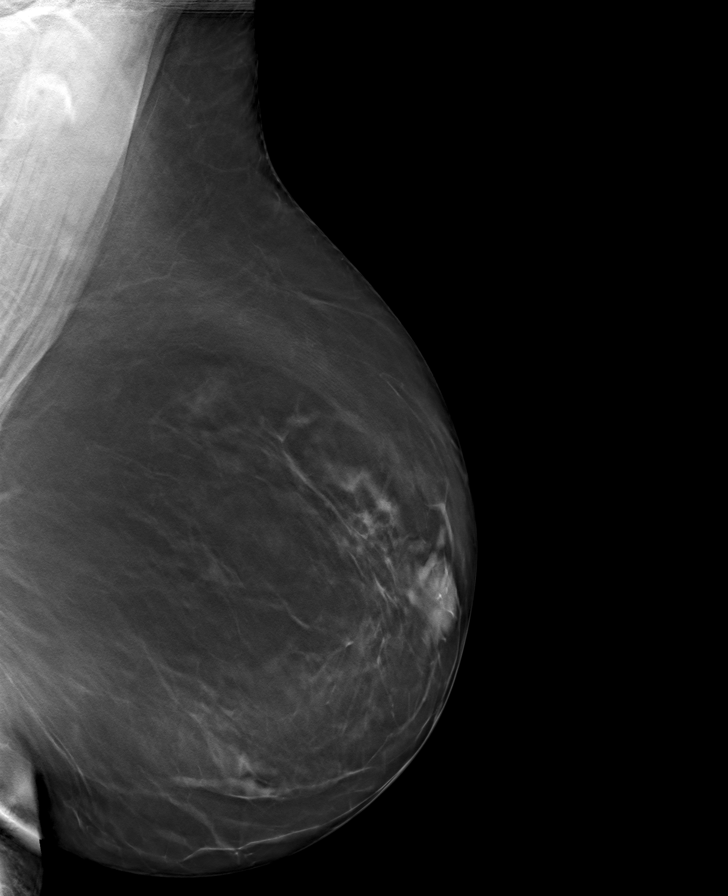

[R CC tomo · tomo slice 48/95.0]
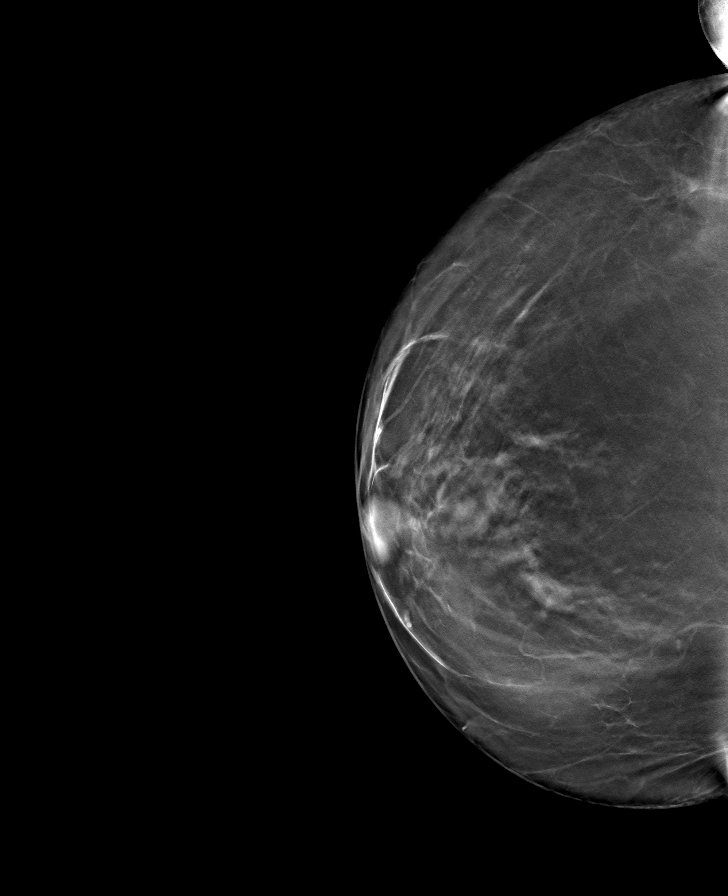

[8 of 24 positions shown; findings below may reference images not displayed]

ACR Breast Density Category b: There are scattered areas of
fibroglandular density.
FINDINGS: There are no findings suspicious for malignancy.
IMPRESSION: No mammographic evidence of malignancy. A result letter of this
screening mammogram will be mailed directly to the patient.

RECOMMENDATION:
Screening mammogram in one year. (Code:51-O-LD2)

BI-RADS CATEGORY  1: Negative.

## 2023-11-15 ENCOUNTER — Encounter (HOSPITAL_COMMUNITY): Payer: Self-pay | Admitting: *Deleted

## 2024-04-28 ENCOUNTER — Other Ambulatory Visit: Payer: Self-pay | Admitting: Family Medicine

## 2024-04-28 DIAGNOSIS — Z0000 Encounter for general adult medical examination without abnormal findings: Secondary | ICD-10-CM

## 2024-05-28 ENCOUNTER — Ambulatory Visit
Admission: RE | Admit: 2024-05-28 | Discharge: 2024-05-28 | Disposition: A | Discharge status: Home / Self Care | Payer: Self-pay | Source: Ambulatory Visit | Attending: Family Medicine | Admitting: Family Medicine

## 2024-05-28 DIAGNOSIS — Z0000 Encounter for general adult medical examination without abnormal findings: Secondary | ICD-10-CM
